# Patient Record
Sex: Female | Born: 1935 | Race: White | Hispanic: No | Marital: Married | Smoking: Never smoker
Health system: Southern US, Community
[De-identification: ages and names within clinical notes are randomized; demographics above are authoritative.]

## PROBLEM LIST (undated history)

## (undated) DIAGNOSIS — C50919 Malignant neoplasm of unspecified site of unspecified female breast: Secondary | ICD-10-CM

## (undated) DIAGNOSIS — F0390 Unspecified dementia without behavioral disturbance: Secondary | ICD-10-CM

## (undated) HISTORY — PX: MASTECTOMY: SHX3

## (undated) HISTORY — PX: BACK SURGERY: SHX140

---

## 1997-11-22 ENCOUNTER — Ambulatory Visit (HOSPITAL_COMMUNITY): Admission: RE | Admit: 1997-11-22 | Discharge: 1997-11-22 | Payer: Self-pay | Admitting: Internal Medicine

## 1998-03-11 ENCOUNTER — Ambulatory Visit (HOSPITAL_COMMUNITY): Admission: RE | Admit: 1998-03-11 | Discharge: 1998-03-11 | Payer: Self-pay | Admitting: Obstetrics & Gynecology

## 1998-12-05 ENCOUNTER — Ambulatory Visit (HOSPITAL_COMMUNITY): Admission: RE | Admit: 1998-12-05 | Discharge: 1998-12-05 | Payer: Self-pay | Admitting: Internal Medicine

## 1998-12-13 ENCOUNTER — Ambulatory Visit (HOSPITAL_COMMUNITY): Admission: RE | Admit: 1998-12-13 | Discharge: 1998-12-13 | Payer: Self-pay

## 1999-03-10 ENCOUNTER — Ambulatory Visit (HOSPITAL_COMMUNITY): Admission: RE | Admit: 1999-03-10 | Discharge: 1999-03-10 | Payer: Self-pay | Admitting: *Deleted

## 1999-12-21 ENCOUNTER — Ambulatory Visit (HOSPITAL_COMMUNITY): Admission: RE | Admit: 1999-12-21 | Discharge: 1999-12-21 | Payer: Self-pay | Admitting: Internal Medicine

## 2000-03-08 ENCOUNTER — Ambulatory Visit (HOSPITAL_COMMUNITY): Admission: RE | Admit: 2000-03-08 | Discharge: 2000-03-08 | Payer: Self-pay | Admitting: Obstetrics & Gynecology

## 2000-12-25 ENCOUNTER — Encounter: Payer: Self-pay | Admitting: Internal Medicine

## 2000-12-25 ENCOUNTER — Ambulatory Visit (HOSPITAL_COMMUNITY): Admission: RE | Admit: 2000-12-25 | Discharge: 2000-12-25 | Payer: Self-pay | Admitting: Internal Medicine

## 2001-01-01 ENCOUNTER — Encounter: Admission: RE | Admit: 2001-01-01 | Discharge: 2001-01-01 | Payer: Self-pay | Admitting: Internal Medicine

## 2001-01-01 ENCOUNTER — Encounter: Payer: Self-pay | Admitting: Internal Medicine

## 2001-11-21 ENCOUNTER — Ambulatory Visit (HOSPITAL_COMMUNITY): Admission: RE | Admit: 2001-11-21 | Discharge: 2001-11-21 | Payer: Self-pay | Admitting: Obstetrics & Gynecology

## 2002-01-02 ENCOUNTER — Encounter: Admission: RE | Admit: 2002-01-02 | Discharge: 2002-01-02 | Payer: Self-pay | Admitting: Oncology

## 2002-01-02 ENCOUNTER — Encounter: Payer: Self-pay | Admitting: Oncology

## 2004-04-12 ENCOUNTER — Ambulatory Visit: Payer: Self-pay | Admitting: Oncology

## 2004-05-04 ENCOUNTER — Ambulatory Visit: Payer: Self-pay | Admitting: Oncology

## 2004-05-10 ENCOUNTER — Ambulatory Visit: Payer: Self-pay | Admitting: Internal Medicine

## 2004-10-12 ENCOUNTER — Ambulatory Visit: Payer: Self-pay | Admitting: Oncology

## 2004-11-02 ENCOUNTER — Ambulatory Visit: Payer: Self-pay | Admitting: Oncology

## 2005-01-16 ENCOUNTER — Ambulatory Visit: Payer: Self-pay | Admitting: Oncology

## 2005-02-02 ENCOUNTER — Ambulatory Visit: Payer: Self-pay | Admitting: Oncology

## 2005-04-12 ENCOUNTER — Ambulatory Visit: Payer: Self-pay | Admitting: Oncology

## 2005-05-04 ENCOUNTER — Ambulatory Visit: Payer: Self-pay | Admitting: Oncology

## 2005-08-02 ENCOUNTER — Ambulatory Visit: Payer: Self-pay | Admitting: Gastroenterology

## 2005-08-10 ENCOUNTER — Ambulatory Visit: Payer: Self-pay | Admitting: Oncology

## 2005-09-02 ENCOUNTER — Ambulatory Visit: Payer: Self-pay | Admitting: Oncology

## 2005-10-25 ENCOUNTER — Ambulatory Visit: Payer: Self-pay | Admitting: Oncology

## 2006-02-06 ENCOUNTER — Ambulatory Visit: Payer: Self-pay | Admitting: Oncology

## 2006-03-04 ENCOUNTER — Ambulatory Visit: Payer: Self-pay | Admitting: Oncology

## 2006-06-06 ENCOUNTER — Ambulatory Visit: Payer: Self-pay | Admitting: Oncology

## 2006-07-05 ENCOUNTER — Ambulatory Visit: Payer: Self-pay | Admitting: Oncology

## 2006-11-27 ENCOUNTER — Ambulatory Visit: Payer: Self-pay | Admitting: Oncology

## 2006-12-04 ENCOUNTER — Ambulatory Visit: Payer: Self-pay | Admitting: Oncology

## 2007-01-03 ENCOUNTER — Ambulatory Visit: Payer: Self-pay | Admitting: Oncology

## 2007-01-16 ENCOUNTER — Ambulatory Visit: Payer: Self-pay | Admitting: Oncology

## 2007-02-03 ENCOUNTER — Ambulatory Visit: Payer: Self-pay | Admitting: Oncology

## 2007-07-17 ENCOUNTER — Ambulatory Visit: Payer: Self-pay | Admitting: Oncology

## 2007-08-03 ENCOUNTER — Ambulatory Visit: Payer: Self-pay | Admitting: Oncology

## 2007-09-11 ENCOUNTER — Ambulatory Visit: Payer: Self-pay | Admitting: Unknown Physician Specialty

## 2007-11-28 ENCOUNTER — Ambulatory Visit: Payer: Self-pay | Admitting: Oncology

## 2008-01-14 ENCOUNTER — Ambulatory Visit: Payer: Self-pay | Admitting: Oncology

## 2008-02-03 ENCOUNTER — Ambulatory Visit: Payer: Self-pay | Admitting: Oncology

## 2008-07-05 ENCOUNTER — Ambulatory Visit: Payer: Self-pay | Admitting: Oncology

## 2008-07-14 ENCOUNTER — Ambulatory Visit: Payer: Self-pay | Admitting: Oncology

## 2008-08-02 ENCOUNTER — Ambulatory Visit: Payer: Self-pay | Admitting: Oncology

## 2008-11-27 ENCOUNTER — Emergency Department: Payer: Self-pay | Admitting: Internal Medicine

## 2008-12-09 ENCOUNTER — Ambulatory Visit: Payer: Self-pay | Admitting: Internal Medicine

## 2009-01-02 ENCOUNTER — Ambulatory Visit: Payer: Self-pay | Admitting: Oncology

## 2009-01-12 ENCOUNTER — Ambulatory Visit: Payer: Self-pay | Admitting: Oncology

## 2009-02-02 ENCOUNTER — Ambulatory Visit: Payer: Self-pay | Admitting: Oncology

## 2009-02-26 ENCOUNTER — Emergency Department: Payer: Self-pay | Admitting: Emergency Medicine

## 2009-07-05 ENCOUNTER — Ambulatory Visit: Payer: Self-pay | Admitting: Oncology

## 2009-07-13 ENCOUNTER — Ambulatory Visit: Payer: Self-pay | Admitting: Oncology

## 2009-08-02 ENCOUNTER — Ambulatory Visit: Payer: Self-pay | Admitting: Oncology

## 2009-09-24 ENCOUNTER — Inpatient Hospital Stay: Payer: Self-pay | Admitting: Internal Medicine

## 2009-12-13 ENCOUNTER — Ambulatory Visit: Payer: Self-pay | Admitting: Oncology

## 2010-01-02 ENCOUNTER — Ambulatory Visit: Payer: Self-pay | Admitting: Oncology

## 2010-01-11 ENCOUNTER — Ambulatory Visit: Payer: Self-pay | Admitting: Oncology

## 2010-01-30 ENCOUNTER — Ambulatory Visit: Payer: Self-pay | Admitting: Internal Medicine

## 2010-02-02 ENCOUNTER — Ambulatory Visit: Payer: Self-pay | Admitting: Oncology

## 2010-02-03 ENCOUNTER — Ambulatory Visit: Payer: Self-pay | Admitting: Otolaryngology

## 2010-02-09 ENCOUNTER — Ambulatory Visit: Payer: Self-pay | Admitting: Otolaryngology

## 2010-02-10 ENCOUNTER — Ambulatory Visit: Payer: Self-pay | Admitting: Oncology

## 2010-02-11 LAB — PATHOLOGY REPORT

## 2010-07-31 ENCOUNTER — Ambulatory Visit: Payer: Self-pay | Admitting: Oncology

## 2010-08-01 LAB — CANCER ANTIGEN 27.29: CA 27.29: 8.9 U/mL (ref 0.0–38.6)

## 2010-08-03 ENCOUNTER — Ambulatory Visit: Payer: Self-pay | Admitting: Oncology

## 2010-09-03 ENCOUNTER — Ambulatory Visit: Payer: Self-pay | Admitting: Oncology

## 2010-12-18 ENCOUNTER — Ambulatory Visit: Payer: Self-pay | Admitting: Oncology

## 2011-02-08 ENCOUNTER — Ambulatory Visit: Payer: Self-pay | Admitting: Oncology

## 2011-02-09 LAB — CANCER ANTIGEN 27.29: CA 27.29: 10.2 U/mL (ref 0.0–38.6)

## 2011-03-05 ENCOUNTER — Ambulatory Visit: Payer: Self-pay | Admitting: Oncology

## 2011-08-15 ENCOUNTER — Ambulatory Visit: Payer: Self-pay | Admitting: Oncology

## 2011-08-15 LAB — CBC CANCER CENTER
Basophil #: 0 x10 3/mm (ref 0.0–0.1)
Basophil %: 0.4 %
Eosinophil #: 0.1 x10 3/mm (ref 0.0–0.7)
HGB: 12.7 g/dL (ref 12.0–16.0)
Lymphocyte #: 1.6 x10 3/mm (ref 1.0–3.6)
MCH: 31.1 pg (ref 26.0–34.0)
MCHC: 33.5 g/dL (ref 32.0–36.0)
MCV: 93 fL (ref 80–100)
Monocyte #: 0.7 x10 3/mm (ref 0.0–0.7)
Neutrophil #: 4.6 x10 3/mm (ref 1.4–6.5)
Neutrophil %: 65.7 %
RBC: 4.08 10*6/uL (ref 3.80–5.20)

## 2011-08-15 LAB — COMPREHENSIVE METABOLIC PANEL
Albumin: 3.6 g/dL (ref 3.4–5.0)
Alkaline Phosphatase: 58 U/L (ref 50–136)
Anion Gap: 5 — ABNORMAL LOW (ref 7–16)
Calcium, Total: 8.9 mg/dL (ref 8.5–10.1)
EGFR (Non-African Amer.): >60
Glucose: 102 mg/dL — ABNORMAL HIGH (ref 65–99)
Potassium: 4.2 mmol/L (ref 3.5–5.1)
SGOT(AST): 31 U/L (ref 15–37)
SGPT (ALT): 37 U/L
Total Protein: 7.2 g/dL (ref 6.4–8.2)

## 2011-09-03 ENCOUNTER — Ambulatory Visit: Payer: Self-pay | Admitting: Oncology

## 2012-01-28 ENCOUNTER — Ambulatory Visit: Payer: Self-pay | Admitting: Oncology

## 2012-02-11 ENCOUNTER — Ambulatory Visit: Payer: Self-pay | Admitting: Oncology

## 2012-02-11 LAB — CBC CANCER CENTER
Basophil #: 0 x10 3/mm (ref 0.0–0.1)
Eosinophil #: 0.1 x10 3/mm (ref 0.0–0.7)
Eosinophil %: 1 %
HGB: 12.5 g/dL (ref 12.0–16.0)
Lymphocyte #: 1.6 x10 3/mm (ref 1.0–3.6)
Lymphocyte %: 20.8 %
MCV: 90 fL (ref 80–100)
Monocyte %: 10.2 %
Neutrophil #: 5.1 x10 3/mm (ref 1.4–6.5)
Neutrophil %: 67.4 %
Platelet: 230 x10 3/mm (ref 150–440)
RBC: 4.34 10*6/uL (ref 3.80–5.20)
WBC: 7.6 x10 3/mm (ref 3.6–11.0)

## 2012-02-11 LAB — COMPREHENSIVE METABOLIC PANEL
Albumin: 3.5 g/dL (ref 3.4–5.0)
Anion Gap: 8 (ref 7–16)
BUN: 19 mg/dL — ABNORMAL HIGH (ref 7–18)
Bilirubin,Total: 0.5 mg/dL (ref 0.2–1.0)
Calcium, Total: 9.7 mg/dL (ref 8.5–10.1)
Creatinine: 1.06 mg/dL (ref 0.60–1.30)
EGFR (Non-African Amer.): 48 — ABNORMAL LOW
Glucose: 101 mg/dL — ABNORMAL HIGH (ref 65–99)
Osmolality: 284 (ref 275–301)
Potassium: 4.1 mmol/L (ref 3.5–5.1)
SGOT(AST): 24 U/L (ref 15–37)
Sodium: 141 mmol/L (ref 136–145)
Total Protein: 7.5 g/dL (ref 6.4–8.2)

## 2012-03-04 ENCOUNTER — Ambulatory Visit: Payer: Self-pay | Admitting: Oncology

## 2012-07-17 ENCOUNTER — Inpatient Hospital Stay: Payer: Self-pay

## 2012-07-17 LAB — CBC
HGB: 11.1 g/dL — ABNORMAL LOW (ref 12.0–16.0)
MCH: 30.2 pg (ref 26.0–34.0)
MCHC: 33 g/dL (ref 32.0–36.0)
MCV: 92 fL (ref 80–100)
RDW: 13.8 % (ref 11.5–14.5)
WBC: 6.4 10*3/uL (ref 3.6–11.0)

## 2012-07-17 LAB — COMPREHENSIVE METABOLIC PANEL
Albumin: 2.9 g/dL — ABNORMAL LOW (ref 3.4–5.0)
Alkaline Phosphatase: 47 U/L — ABNORMAL LOW (ref 50–136)
Anion Gap: 7 (ref 7–16)
BUN: 20 mg/dL — ABNORMAL HIGH (ref 7–18)
Bilirubin,Total: 0.4 mg/dL (ref 0.2–1.0)
Calcium, Total: 8 mg/dL — ABNORMAL LOW (ref 8.5–10.1)
Chloride: 110 mmol/L — ABNORMAL HIGH (ref 98–107)
Co2: 25 mmol/L (ref 21–32)
Creatinine: 1.13 mg/dL (ref 0.60–1.30)
EGFR (African American): 51 — ABNORMAL LOW
EGFR (Non-African Amer.): 44 — ABNORMAL LOW
Glucose: 147 mg/dL — ABNORMAL HIGH (ref 65–99)
Osmolality: 288 (ref 275–301)
Potassium: 4 mmol/L (ref 3.5–5.1)
SGOT(AST): 26 U/L (ref 15–37)
SGPT (ALT): 24 U/L (ref 12–78)
Sodium: 142 mmol/L (ref 136–145)
Total Protein: 5.9 g/dL — ABNORMAL LOW (ref 6.4–8.2)

## 2012-07-17 LAB — CK TOTAL AND CKMB (NOT AT ARMC)
CK, Total: 90 U/L (ref 21–215)
CK-MB: 1.2 ng/mL (ref 0.5–3.6)

## 2012-07-17 LAB — URINALYSIS, COMPLETE
Leukocyte Esterase: NEGATIVE
Ph: 7 (ref 4.5–8.0)
RBC,UR: <1 /HPF (ref 0–5)
Specific Gravity: 1.01 (ref 1.003–1.030)
Squamous Epithelial: <1

## 2012-07-17 LAB — TROPONIN I: Troponin-I: <0.02 ng/mL

## 2012-07-18 LAB — CBC WITH DIFFERENTIAL/PLATELET
Basophil #: 0 10*3/uL (ref 0.0–0.1)
Basophil %: 0.4 %
Eosinophil #: 0.2 10*3/uL (ref 0.0–0.7)
Eosinophil %: 2.4 %
HGB: 10.8 g/dL — ABNORMAL LOW (ref 12.0–16.0)
Lymphocyte %: 17.8 %
MCH: 30.4 pg (ref 26.0–34.0)
MCHC: 33.7 g/dL (ref 32.0–36.0)
MCV: 90 fL (ref 80–100)
Monocyte %: 11.2 %
Platelet: 164 10*3/uL (ref 150–440)
RDW: 14 % (ref 11.5–14.5)
WBC: 6.8 10*3/uL (ref 3.6–11.0)

## 2012-07-18 LAB — BASIC METABOLIC PANEL
Anion Gap: 6 — ABNORMAL LOW (ref 7–16)
Calcium, Total: 8 mg/dL — ABNORMAL LOW (ref 8.5–10.1)
Chloride: 112 mmol/L — ABNORMAL HIGH (ref 98–107)
Co2: 27 mmol/L (ref 21–32)
Creatinine: 0.82 mg/dL (ref 0.60–1.30)
EGFR (African American): >60
EGFR (Non-African Amer.): >60
Potassium: 3.6 mmol/L (ref 3.5–5.1)
Sodium: 145 mmol/L (ref 136–145)

## 2012-07-18 LAB — LIPID PANEL
Cholesterol: 127 mg/dL (ref 0–200)
HDL Cholesterol: 51 mg/dL (ref 40–60)

## 2012-07-20 LAB — BASIC METABOLIC PANEL
Anion Gap: 8 (ref 7–16)
BUN: 14 mg/dL (ref 7–18)
Calcium, Total: 8.3 mg/dL — ABNORMAL LOW (ref 8.5–10.1)
Co2: 26 mmol/L (ref 21–32)
Creatinine: 0.68 mg/dL (ref 0.60–1.30)
EGFR (African American): >60
EGFR (Non-African Amer.): >60
Glucose: 92 mg/dL (ref 65–99)
Potassium: 3.6 mmol/L (ref 3.5–5.1)

## 2012-07-20 LAB — MAGNESIUM: Magnesium: 1.7 mg/dL — ABNORMAL LOW

## 2012-07-21 LAB — BASIC METABOLIC PANEL
BUN: 10 mg/dL (ref 7–18)
Chloride: 109 mmol/L — ABNORMAL HIGH (ref 98–107)
Co2: 28 mmol/L (ref 21–32)
Creatinine: 0.61 mg/dL (ref 0.60–1.30)
EGFR (African American): >60
Potassium: 3.5 mmol/L (ref 3.5–5.1)

## 2012-07-21 LAB — MAGNESIUM: Magnesium: 1.6 mg/dL — ABNORMAL LOW

## 2012-07-24 ENCOUNTER — Encounter: Payer: Self-pay | Admitting: Internal Medicine

## 2012-08-02 ENCOUNTER — Encounter: Payer: Self-pay | Admitting: Internal Medicine

## 2012-08-02 ENCOUNTER — Ambulatory Visit: Payer: Self-pay | Admitting: Oncology

## 2012-09-16 ENCOUNTER — Ambulatory Visit: Payer: Self-pay | Admitting: Oncology

## 2012-09-30 LAB — CBC CANCER CENTER
Basophil %: 0.5 %
Eosinophil #: 0.1 x10 3/mm (ref 0.0–0.7)
Eosinophil %: 1.1 %
HCT: 36.9 % (ref 35.0–47.0)
Lymphocyte #: 1.3 x10 3/mm (ref 1.0–3.6)
Lymphocyte %: 14.1 %
Monocyte %: 7.9 %
Neutrophil #: 7.3 x10 3/mm — ABNORMAL HIGH (ref 1.4–6.5)
Platelet: 249 x10 3/mm (ref 150–440)
RBC: 4.11 10*6/uL (ref 3.80–5.20)
RDW: 14.5 % (ref 11.5–14.5)

## 2012-09-30 LAB — COMPREHENSIVE METABOLIC PANEL
Albumin: 3.4 g/dL (ref 3.4–5.0)
Alkaline Phosphatase: 56 U/L (ref 50–136)
Anion Gap: 3 — ABNORMAL LOW (ref 7–16)
BUN: 17 mg/dL (ref 7–18)
Calcium, Total: 8.8 mg/dL (ref 8.5–10.1)
Chloride: 106 mmol/L (ref 98–107)
Creatinine: 0.78 mg/dL (ref 0.60–1.30)
EGFR (Non-African Amer.): >60
Potassium: 3.7 mmol/L (ref 3.5–5.1)
SGOT(AST): 25 U/L (ref 15–37)
Sodium: 140 mmol/L (ref 136–145)

## 2012-10-02 ENCOUNTER — Ambulatory Visit: Payer: Self-pay | Admitting: Oncology

## 2013-06-10 DIAGNOSIS — F02818 Dementia in other diseases classified elsewhere, unspecified severity, with other behavioral disturbance: Secondary | ICD-10-CM | POA: Insufficient documentation

## 2013-06-10 DIAGNOSIS — G301 Alzheimer's disease with late onset: Secondary | ICD-10-CM

## 2013-06-10 DIAGNOSIS — F0281 Dementia in other diseases classified elsewhere with behavioral disturbance: Secondary | ICD-10-CM | POA: Insufficient documentation

## 2013-06-10 DIAGNOSIS — G4700 Insomnia, unspecified: Secondary | ICD-10-CM | POA: Insufficient documentation

## 2013-10-09 ENCOUNTER — Ambulatory Visit: Payer: Self-pay | Admitting: Oncology

## 2013-10-13 LAB — CBC CANCER CENTER
Basophil #: 0.1 x10 3/mm (ref 0.0–0.1)
Basophil %: 0.7 %
EOS ABS: 0.3 x10 3/mm (ref 0.0–0.7)
EOS PCT: 3.6 %
HCT: 41.6 % (ref 35.0–47.0)
HGB: 13.7 g/dL (ref 12.0–16.0)
Lymphocyte #: 1.3 x10 3/mm (ref 1.0–3.6)
Lymphocyte %: 17.4 %
MCH: 30.1 pg (ref 26.0–34.0)
MCHC: 33 g/dL (ref 32.0–36.0)
MCV: 91 fL (ref 80–100)
MONOS PCT: 9 %
Monocyte #: 0.7 x10 3/mm (ref 0.2–0.9)
Neutrophil #: 5.1 x10 3/mm (ref 1.4–6.5)
Neutrophil %: 69.3 %
Platelet: 218 x10 3/mm (ref 150–440)
RBC: 4.56 10*6/uL (ref 3.80–5.20)
RDW: 13.5 % (ref 11.5–14.5)
WBC: 7.4 x10 3/mm (ref 3.6–11.0)

## 2013-10-13 LAB — COMPREHENSIVE METABOLIC PANEL
ALBUMIN: 3.6 g/dL (ref 3.4–5.0)
ANION GAP: 8 (ref 7–16)
Alkaline Phosphatase: 48 U/L
BUN: 17 mg/dL (ref 7–18)
Bilirubin,Total: 0.5 mg/dL (ref 0.2–1.0)
CALCIUM: 9.2 mg/dL (ref 8.5–10.1)
Chloride: 105 mmol/L (ref 98–107)
Co2: 30 mmol/L (ref 21–32)
Creatinine: 1.02 mg/dL (ref 0.60–1.30)
EGFR (African American): 57 — ABNORMAL LOW
EGFR (Non-African Amer.): 49 — ABNORMAL LOW
Glucose: 104 mg/dL — ABNORMAL HIGH (ref 65–99)
Osmolality: 287 (ref 275–301)
POTASSIUM: 3.8 mmol/L (ref 3.5–5.1)
SGOT(AST): 16 U/L (ref 15–37)
SGPT (ALT): 15 U/L (ref 12–78)
Sodium: 143 mmol/L (ref 136–145)
TOTAL PROTEIN: 7 g/dL (ref 6.4–8.2)

## 2013-11-02 ENCOUNTER — Ambulatory Visit: Payer: Self-pay | Admitting: Oncology

## 2014-03-19 DIAGNOSIS — I495 Sick sinus syndrome: Secondary | ICD-10-CM | POA: Insufficient documentation

## 2014-09-16 ENCOUNTER — Emergency Department
Admit: 2014-09-16 | Disposition: A | Discharge status: Home / Self Care | Payer: Self-pay | Admitting: Emergency Medicine

## 2014-09-16 LAB — CBC
HCT: 42.2 %
HGB: 13.5 g/dL
MCH: 29.8 pg
MCHC: 32.1 g/dL
MCV: 93 fL
Platelet: 229 x10 3/mm 3
RBC: 4.55 X10 6/mm 3
RDW: 13.3 %
WBC: 6.9 x10 3/mm 3

## 2014-09-16 LAB — URINALYSIS, COMPLETE
Bacteria: NONE SEEN
Bilirubin,UR: NEGATIVE
Blood: NEGATIVE
Glucose,UR: NEGATIVE mg/dL
Ketone: NEGATIVE
Leukocyte Esterase: NEGATIVE
Nitrite: NEGATIVE
Ph: 7
Protein: NEGATIVE
Specific Gravity: 1.012

## 2014-09-16 LAB — COMPREHENSIVE METABOLIC PANEL
ANION GAP: 5 — AB (ref 7–16)
Albumin: 3.9 g/dL
Alkaline Phosphatase: 53 U/L
BUN: 15 mg/dL
Bilirubin,Total: 0.3 mg/dL
CALCIUM: 9 mg/dL
CHLORIDE: 107 mmol/L
Co2: 29 mmol/L
Creatinine: 0.77 mg/dL
EGFR (African American): >60
EGFR (Non-African Amer.): >60
GLUCOSE: 97 mg/dL
Potassium: 4 mmol/L
SGOT(AST): 21 U/L
SGPT (ALT): 15 U/L
Sodium: 141 mmol/L
Total Protein: 7.2 g/dL

## 2014-09-16 LAB — TROPONIN I: Troponin-I: <0.03 ng/mL

## 2014-09-16 LAB — DRUG SCREEN, URINE
AMPHETAMINES, UR SCREEN: NEGATIVE
BARBITURATES, UR SCREEN: NEGATIVE
BENZODIAZEPINE, UR SCRN: NEGATIVE
Cannabinoid 50 Ng, Ur ~~LOC~~: NEGATIVE
Cocaine Metabolite,Ur ~~LOC~~: NEGATIVE
MDMA (Ecstasy)Ur Screen: NEGATIVE
METHADONE, UR SCREEN: NEGATIVE
Opiate, Ur Screen: NEGATIVE
PHENCYCLIDINE (PCP) UR S: NEGATIVE
Tricyclic, Ur Screen: NEGATIVE

## 2014-09-24 NOTE — Discharge Summary (Signed)
PATIENT NAME:  JIMMYE, WISNIESKI MR#:  [id number] DATE OF BIRTH:  April 21, 1924  DATE OF ADMISSION:  07/17/2012 DATE OF DISCHARGE:  07/23/2012   ADDENDUM  Yesterday afternoon the patient had a period of unsteadiness and near fall when using the toilet. PT reassessed her and recommended skilled nursing facility. She remained stable overnight. She will be discharged on the same medications and same followup plan to nursing facility.    ____________________________ Cheral Marker. Ola Spurr, MD dpf:jm D: 07/23/2012 13:42:00 ET T: 07/23/2012 13:51:57 ET JOB#: [id number]  cc: Cheral Marker. Ola Spurr, MD, <Dictator> Bernarr Longsworth Ola Spurr MD ELECTRONICALLY SIGNED 08/06/2012 19:24

## 2014-09-24 NOTE — H&P (Signed)
PATIENT NAME:  Beth Randall, Beth Randall MR#:  [id number] DATE OF BIRTH:  [birth year]  DATE OF ADMISSION:  07/17/2012  PRIMARY CARE PHYSICIAN: Dr. Andrey Farmer, now Dr. Ola Spurr   CHIEF COMPLAINT: Syncopal episode and fall with collapse.   REFERRING PHYSICIAN: Dr. Leonides Sake  HISTORY OF PRESENT ILLNESS: [name] is a very nice 81 year old female, who has a history of breast cancer status post right mastectomy. Overall, she is very healthy and very active. She actually plays tennis very often and walks a lot.   The patient comes today with a history of a syncopal episode that happened 2 nights ago while having dinner. The patient had no symptoms prior to the episode. She did not feel dizzy or lightheaded. She was not having any chest pain, shortness of breath or anything else that she can mention. She just collapsed. There was not a clear history of orthostatic symptoms, but the patient was able to go home and did not really care much about it up until today when she was home with her son, she had another syncopal episode. When she collapsed again, she was out for a couple minutes and then came back, and she was asymptomatic again. There was no clear history of orthostatic hypotension at that moment either. There was not any other symptomatology.   The patient was brought here to the ER. When she was observed, she was put on the monitor, and her heart rate has been going up and down. The lowest it has been is 28. The highest it has been is 124.   At this moment, her heart rate is around 70 but is coming down to the mid 40s while I am seeing the patient.   The patient is doing okay right now, and she is asymptomatic.   REVIEW OF SYSTEMS:  CONSTITUTIONAL: Denies any fever, fatigue, weight loss, weight gain.  EYES: Denies any blurry vision, glaucoma. She did have cataract surgery.  ENT: No tinnitus. No ear pain. The patient states that she has a cold going on where she has nasal congestion and  occasional cough, but no shortness of breath or pulmonary congestion.  RESPIRATORY: Positive mild cough due to postnasal drip. Negative wheezing, hemoptysis or dyspnea.  CARDIOVASCULAR: No chest pain. No orthopnea. No edema. No arrhythmias. No palpitations. Positive syncope x2. No varicose veins.  GASTROINTESTINAL: No nausea, vomiting, abdominal pain, rectal bleeding or hemorrhoids.  GENITOURINARY: No dysuria, hematuria. No changes in frequency.  GYNECOLOGIC: Positive breast cancer status post right mastectomy. No new masses or other breast problems.  ENDOCRINE: No polyuria, polydipsia or polyphagia. No cold or heat intolerance. No thyroid problems.  HEMATOLOGIC/LYMPHATIC: No anemia, easy bruising or bleeding or swollen glands.  SKIN: Without any rashes or petechiae.  MUSCULOSKELETAL: No significant back pain, neck pain, shoulder pain or gout.  NEUROLOGIC: No numbness, weakness. No CVAs. No TIAs. No ataxia.  PSYCHIATRIC: No insomnia, depression or other mood problems.   PAST MEDICAL HISTORY: 1.  Breast cancer.  2.  Osteoarthritis.  3.  Cataracts.  3.  Colonic polyps.   PAST SURGICAL HISTORY: 1.  Status post colon polyp removal.  2.  Bilateral cataract surgery.  3.  Right mastectomy.  4.  Cholecystectomy.   SOCIAL HISTORY: The patient lives with her husband and son. She plays tennis very often, once or twice a week. She has never smoked, has never drank. Overall, she has been healthy.   FAMILY HISTORY: She denies any hypertension, MIs in her family. Her mother and father  died from natural deaths.   PHYSICAL EXAMINATION: VITAL SIGNS: Heart rate has been going up in between 124 the highest, 28 the lowest; seen on the monitor right now in between 40 and 70. Blood pressure was as low as 77/39, now is in the mid 90s/70s, which apparently is her normal blood pressure. Her respiratory rate is 20. Temperature is afebrile.  GENERAL: The patient is alert, oriented x3, no acute distress, no  respiratory distress, hemodynamically stable.  HEENT: Pupils are equal and reactive. Extraocular movements are intact. Mucosa is moist. Anicteric sclerae. Pink conjunctivae. No oral lesions. No oropharyngeal exudates.  NECK: Supple. No JVD. No thyromegaly. No adenopathy. No carotid bruits. The patient is thin. No rigidity of the neck.  CARDIOVASCULAR: Regular rate and rhythm. No murmurs, rubs or gallops are appreciated. At this moment, heart rate is in the mid 70s but comes down to the 50s and 40s. No displacement of PMI. No tenderness to palpation of anterior chest wall.  LUNGS: Clear without any wheezing or crepitus. No use of accessory muscles. No dullness to percussion.  ABDOMEN: Soft, nontender, nondistended. No hepatosplenomegaly. No masses. Bowel sounds are positive.  EXTREMITIES: No edema. No cyanosis. No clubbing.  NEUROLOGIC: Cranial nerves II-XII intact. No focal findings. Strength is 5/5 in 4 extremities.  SKIN: Without any rashes or petechiae, decreased turgor.  PSYCHIATRIC: Mood is normal without any signs of depression or anxiety.  MUSCULOSKELETAL: No significant joint deformities or effusions.  LYMPHATIC: Negative for any lymphadenopathy in the neck or supraclavicular areas.   EKG: The patient has normal sinus rhythm with occasional PVCs, actually only 1 PVC, may have some junctional rhythm, and apparently has a borderline first-degree AV block as well.   LABS: Glucose 147, BUN is 20, creatinine 113, chloride is 110.   Calcium 8, total protein 5.9, albumin 2.9, alkaline phosphatase less than 47. White blood count is 6.4, hemoglobin 11.1, platelets 164. UA is negative for signs of infection.   ASSESSMENT AND PLAN: An 81 year old female with history of breast cancer in the past status post mastectomy, healthy otherwise, who comes with a history of a syncopal episode 2 nights ago and then today, low blood pressure and a heart rate that ranges in between 28 and 124.  1.  Syncope,  likely due to significant bradycardia. The patient did not show any orthostatic symptoms, although her BUN is slightly elevated and she looks a little bit dehydrated, and her blood pressure was low, for which fluids were given. I do not think there is any need for any further testing, like an ultrasound of the carotids or MRIs, but we are going to go ahead and get an echo. Cardiology consult done due to her tachybrady syndrome.  2.  Tachybrady syndrome. The patient has heart rate going between 28 to 124. At this moment, her heart rate is staying around 70s, dropping down to the 40s. Due to the severity of her bradycardia and symptomatology not associated with changes on position, I am going to send the patient to the Critical Care Unit, put pads on her chest and make sure she does not have any significant events. Atropine is going to be given in case of symptomatic bradycardia. The patient's low blood pressure has improved after 2 L of IV fluids, and that was likely due to the patient being slightly dehydrated and also bradycardic. The patient's normal blood pressure is usually in between 90 and 100/70s. At this moment, that is why the blood pressure has been  going on, but the lowest that she was was 70s/40s here. Cardiology has been consulted. The patient is very likely to need a pacemaker.  3.  History of breast cancer, has resolved, status post right mastectomy.  4.  Elevated blood sugar. This is likely due to stress. The patient does not have a history of diabetes. She is very thin, so I am going to repeat the blood sugar in the morning and if there is any elevation consider checking hemoglobin A1c. I am not going to order that for now.  5.  Mild dehydration status post IV fluids.  6.  Hypotension, has resolved, and it was likely due to dehydration.  7.  Slight anemia. The patient has a history of chronic anemia, and at this moment, her hemoglobin is stable at 11.1.  8.  The patient is a FULL CODE at this  moment. She is going to discuss this with her family, and still, there is a question if she would like to have a pacemaker or not.  9.  The patient is going to be admitted to the Critical Care Unit for potential life-threatening arrhythmias like severe bradycardia, and she is going to have external pacemaker applied to her in case her heart rate comes down again and she is symptomatic.   CRITICAL CARE TIME: About 50 minutes.    ____________________________ Kosse Sink, MD rsg:lg D: 07/17/2012 15:28:57 ET T: 07/17/2012 16:14:22 ET JOB#: [id number]  cc: Eagle Sink, MD, <Dictator> Eshaal Duby America Brown MD ELECTRONICALLY SIGNED 07/23/2012 14:31

## 2014-09-24 NOTE — Discharge Summary (Signed)
PATIENT NAME:  Beth Randall, Beth Randall MR#:  [id number] DATE OF BIRTH:  [birth year]  DATE OF ADMISSION:  07/17/2012 DATE OF DISCHARGE:  07/22/2012  PRIMARY CARE PHYSICIAN: Adrian Prows, MD  CONSULTING CARDIOLOGIST: Bartholome Bill, MD   DISCHARGE DIAGNOSES: 1. Syncope, likely due to sick sinus syndrome.  2. Bradycardia, possible evidence of sick sinus syndrome.  3. Dementia, moderate.  4. Mild malnutrition   HISTORY OF PRESENT ILLNESS: Please see admission history and physical dated 07/17/2012. Briefly, this is an 81 year old woman, who lives with her husband, who suffered recent falling. She also lives with her son. She had been pretty active except for her dementia. She came in with a syncopal episode 2 days prior when she had injured her elbow at the time. When she was brought to the Emergency Room, she was noted to have bradycardia on the monitor with heart rate down to the 30s. She was admitted for further evaluation and consideration of a permanent pacemaker.   HOSPITAL COURSE BY ISSUE:  1. Syncope: Evaluation was performed, including echocardiogram and telemetry. Her echo showed normal EF of 60% with mild-to-moderate MR and TR. Telemetry remained normal without any significant brady- or tachyarrhythmias. No further syncopal episodes occurred. She was seen by Cardiology initially and spent a day or two in the ICU.  Decision was made since she did not meet criteria for permanent pacemaker that she would not need a pacemaker at this time. Plan will be to discharge her on a 48-hour Holter monitor to see how her heart rate does when she is in her usual environment with her usual level of activity.  2. Dementia: This is moderate. She is not on treatment for it. She required some dosing of Haldol for agitation and also required a sitter. She will be discharged on Haldol.  3. Mild malnutrition: Albumin was 2.9 and BMI is 16. She is on a multivitamin and was started on [employer]  supplements.  4. Immobility and falls: The patient was seen by PT who recommended home with Home Health. We will arrange this for physical therapy and nursing.   DISCHARGE FOLLOWUP: The patient will follow up with Dr. Ola Spurr in 1 to 2 weeks and with Dr. Ubaldo Glassing in 1 to 2 weeks.   DISCHARGE MEDICATIONS: 1. Multivitamin once a day.  2. Haldol 0.5 mg, 1 tablet 3 times a day as needed for agitation.   REFERRAL:  [facility]charge Home [facility] for physical therapy and nursing.   DISCHARGE DIET: Regular with a regular consistency.   DISCHARGE ACTIVITY: As tolerated.   TIME SPENT: This discharge took 35 minutes.   ____________________________ Cheral Marker. Ola Spurr, MD dpf:cb D: 07/22/2012 13:10:00 ET T: 07/22/2012 13:29:57 ET JOB#: [id number]  cc: Cheral Marker. Ola Spurr, MD, <Dictator> Pakou Rainbow Ola Spurr MD ELECTRONICALLY SIGNED 08/06/2012 19:24

## 2014-09-24 NOTE — Consult Note (Signed)
   General Aspect 81 yo female with no history of heart disease who has been fairly active despite her advanced age playing tennis earlier this year prior to the weather getting cold. She was admitted after haveing several apparent syncopal episodes this week. She states she did have a fall but dneis syncope. Review of chart sugests family note syncopal episodes ties two today. IN the er, she had reported tacy brady episodes with rates as low as in the 30's and as high as the 120s. She was admitted and has ruled out for an mi. Telemetry reveals sinus rhythm with occasional pvcs and pacs. She dneies any chest pain. She dneis history of syncope in the past.   Physical Exam:  GEN well developed, well nourished, no acute distress   HEENT hearing intact to voice   NECK supple   RESP normal resp effort   CARD Normal, S1, S2  Murmur   Murmur Systolic   Systolic Murmur Out flow   ABD denies tenderness  no hernia  normal BS   LYMPH negative neck, negative axillae   EXTR negative cyanosis/clubbing, negative edema   SKIN normal to palpation   NEURO cranial nerves intact, motor/sensory function intact   PSYCH alert, poor insight   Review of Systems:  Subjective/Chief Complaint no complaints at present. Apparent syncopal episodes   General: No Complaints   Skin: No Complaints   ENT: No Complaints   Eyes: No Complaints   Neck: No Complaints   Respiratory: No Complaints   Cardiovascular: No Complaints   Gastrointestinal: No Complaints   Genitourinary: No Complaints   Vascular: No Complaints   Musculoskeletal: No Complaints   Neurologic: Fainting   Hematologic: No Complaints   Endocrine: No Complaints   Psychiatric: No Complaints   Review of Systems: All other systems were reviewed and found to be negative   Medications/Allergies Reviewed Medications/Allergies reviewed   EKG:  Interpretation sinus rhythm with pacs and appaarent tachy brady episodes.     Penicillin: Itching  ASA: Other   Impression 81 yo female with admission for apparent syncope with hypotension. Currently has rueld out for an mi and is stable but relatively hypotensive. She had reported tacy and brady episodes in the er. Since coming to [facility] has had no signficant pauses or bradycardic episodes. Pt states she had mechanical falls but apparetly had several episodes where she lost concsiousness. Her hypotension may have caused the events vs bradycardia or pauses.   Plan 1. Conitneu to rule out for mi and follow heart rate and hemodynamics 2. Careful hydration. 3. Echo is pending.  4. COnsider ppm if she has further bradycardic episodes.   Electronic Signatures: Teodoro Spray (MD)  (Signed 13-Feb-14 22:07)  Authored: General Aspect/Present Illness, History and Physical Exam, Review of System, EKG , Allergies, Impression/Plan   Last Updated: 13-Feb-14 22:07 by Teodoro Spray (MD)

## 2014-11-02 ENCOUNTER — Encounter: Payer: Self-pay | Admitting: Emergency Medicine

## 2014-11-02 ENCOUNTER — Emergency Department: Payer: Medicare Other

## 2014-11-02 ENCOUNTER — Emergency Department
Admission: EM | Admit: 2014-11-02 | Discharge: 2014-11-02 | Disposition: A | Discharge status: Home / Self Care | Payer: Medicare Other | Attending: Emergency Medicine | Admitting: Emergency Medicine

## 2014-11-02 DIAGNOSIS — Y9389 Activity, other specified: Secondary | ICD-10-CM | POA: Diagnosis not present

## 2014-11-02 DIAGNOSIS — Y998 Other external cause status: Secondary | ICD-10-CM | POA: Insufficient documentation

## 2014-11-02 DIAGNOSIS — Y9289 Other specified places as the place of occurrence of the external cause: Secondary | ICD-10-CM | POA: Insufficient documentation

## 2014-11-02 DIAGNOSIS — S0101XA Laceration without foreign body of scalp, initial encounter: Secondary | ICD-10-CM | POA: Insufficient documentation

## 2014-11-02 DIAGNOSIS — S51011A Laceration without foreign body of right elbow, initial encounter: Secondary | ICD-10-CM

## 2014-11-02 DIAGNOSIS — W01198A Fall on same level from slipping, tripping and stumbling with subsequent striking against other object, initial encounter: Secondary | ICD-10-CM | POA: Diagnosis not present

## 2014-11-02 DIAGNOSIS — S51001A Unspecified open wound of right elbow, initial encounter: Secondary | ICD-10-CM | POA: Diagnosis not present

## 2014-11-02 DIAGNOSIS — IMO0002 Reserved for concepts with insufficient information to code with codable children: Secondary | ICD-10-CM

## 2014-11-02 DIAGNOSIS — S0990XA Unspecified injury of head, initial encounter: Secondary | ICD-10-CM | POA: Diagnosis present

## 2014-11-02 HISTORY — DX: Malignant neoplasm of unspecified site of unspecified female breast: C50.919

## 2014-11-02 HISTORY — DX: Unspecified dementia, unspecified severity, without behavioral disturbance, psychotic disturbance, mood disturbance, and anxiety: F03.90

## 2014-11-02 MED ORDER — LIDOCAINE HCL (PF) 1 % IJ SOLN
5.0000 mL | Freq: Once | INTRAMUSCULAR | Status: DC
Start: 2014-11-02 — End: 2014-11-02
  Filled 2014-11-02: qty 5

## 2014-11-02 MED ORDER — BACITRACIN-NEOMYCIN-POLYMYXIN 400-5-5000 EX OINT
TOPICAL_OINTMENT | CUTANEOUS | Status: AC
  Administered 2014-11-02: 17:00:00 via TOPICAL
  Filled 2014-11-02: qty 1

## 2014-11-02 NOTE — Discharge Instructions (Signed)
The staples will need to be removed in 7-10 days. Please seek medical attention for any high fevers, chest pain, shortness of breath, change in behavior, persistent vomiting, bloody stool or any other new or concerning symptoms. Staple Care and Removal Your caregiver has used staples today to repair your wound. Staples are used to help a wound heal faster by holding the edges of the wound together. The staples can be removed when the wound has healed well enough to stay together after the staples are removed. A dressing (wound covering), depending on the location of the wound, may have been applied. This may be changed once per day or as instructed. If the dressing sticks, it may be soaked off with soapy water or hydrogen peroxide. Only take over-the-counter or prescription medicines for pain, discomfort, or fever as directed by your caregiver.  If you did not receive a tetanus shot today because you did not recall when your last one was given, check with your caregiver when you have your staples removed to determine if one is needed. Return to your caregiver's office in 1 week or as suggested to have your staples removed. SEEK IMMEDIATE MEDICAL CARE IF:   You have redness, swelling, or increasing pain in the wound.  You have pus coming from the wound.  You have a fever.  You notice a bad smell coming from the wound or dressing.  Your wound edges break open after staples have been removed. Document Released: 02/13/2001 Document Revised: 08/13/2011 Document Reviewed: 02/28/2005 [facility] Patient Information 2015 [place], [place]. This information is not intended to replace advice given to you by your health care provider. Make sure you discuss any questions you have with your health care provider.

## 2014-11-02 NOTE — ED Notes (Signed)
Pt fell while getting out of the shower and hit the top of her head. One inch laceration noted to head. Bleeding controlled with dressing. Pt with caregiver at time of fall. No loc.

## 2014-11-02 NOTE — ED Provider Notes (Signed)
[facility] Emergency Department Provider Note    ____________________________________________  Time seen: 1525  I have reviewed the triage vital signs and the nursing notes.   HISTORY  Chief Complaint Fall and Head Laceration   History limited by: Dementia, history obtained from caretaker.   HPI Beth Randall is a 81 y.o. female who presents to the emergency department today after a fall and laceration. Per the caretaker the patient was coming out of the shower when she slipped on some wet ground. The caretaker was able to hold the patient up so she did not fall completely to the ground. However she did hit her head and suffered a laceration above her forehead. Additionally when the caretaker grabbed her the created some skin tears to the right arm. Denies any loss of consciousness.     Past Medical History  Diagnosis Date  . Dementia   . Breast cancer     There are no active problems to display for this patient.   History reviewed. No pertinent past surgical history.  No current outpatient prescriptions on file.  Allergies Review of patient's allergies indicates no known allergies.  No family history on file.  Social History History  Substance Use Topics  . Smoking status: Never Smoker   . Smokeless tobacco: Not on file  . Alcohol Use: No    Review of Systems  Unable to obtain secondary to dementia.  ____________________________________________   PHYSICAL EXAM:  VITAL SIGNS: ED Triage Vitals  Enc Vitals Group     BP 11/02/14 1236 120/61 mmHg     Pulse Rate 11/02/14 1236 79     Resp 11/02/14 1236 18     Temp 11/02/14 1236 97.5 F (36.4 C)     Temp Source 11/02/14 1236 Oral     SpO2 11/02/14 1236 97 %     Weight 11/02/14 1236 124 lb (56.246 kg)     Height 11/02/14 1236 5\' 5"  (1.651 m)   Constitutional: Awake and alert. Well appearing and in no distress. Eyes: Conjunctivae are normal. PERRL. Normal extraocular  movements. ENT   Head: Normocephalic. Roughly 3 cm vertical laceration above forehead. Hemostatic.   Nose: No congestion/rhinnorhea.   Mouth/Throat: Mucous membranes are moist.   Neck: No stridor. No midline tenderness. Hematological/Lymphatic/Immunilogical: No cervical lymphadenopathy. Cardiovascular: Normal rate, regular rhythm.  Respiratory: Normal respiratory effort without tachypnea nor retractions.  Gastrointestinal: Soft and nontender. No distention.  Genitourinary: Deferred Musculoskeletal: Normal range of motion in all extremities. No joint effusions.  No lower extremity tenderness nor edema. Neurologic:  Normal speech and language. No gross focal neurologic deficits are appreciated. Speech is normal.  Skin:  Skin is warm, dry and intact. No rash noted. Psychiatric: Mood and affect are normal. Speech and behavior are normal. Patient exhibits appropriate insight and judgment.  ____________________________________________    LABS (pertinent positives/negatives)  None  ____________________________________________   EKG  None  ____________________________________________    RADIOLOGY  CT head  IMPRESSION: No acute intracranial abnormality.  Atrophy, chronic microvascular disease. ____________________________________________   PROCEDURES  Procedure(s) performed: laceration repair, see procedure note(s).  Critical Care performed: No  LACERATION REPAIR Performed by: Nance Pear Authorized by: Nance Pear Consent: Verbal consent obtained. Risks and benefits: risks, benefits and alternatives were discussed Consent given by: patient Patient identity confirmed: provided demographic data Prepped and Draped in normal sterile fashion Wound explored  Laceration Location: Frontal scalp  Laceration Length: 3cm  No Foreign Bodies seen or palpated  Anesthesia: None  Cleaning: Chlorhexidine   Irrigation method: Jet  Amount of  cleaning: standard  Skin closure: Staples   Number of staples: 3    Patient tolerance: Patient tolerated the procedure well with no immediate complications.  ____________________________________________   INITIAL IMPRESSION / ASSESSMENT AND PLAN / ED COURSE  Pertinent labs & imaging results that were available during my care of the patient were reviewed by me and considered in my medical decision making (see chart for details).  She here after mechanical fall. 7 laceration to forehead and skin tears on right arm. Will have skin tears cleaned and dressed by nurse with [place]. Will staple forehead. CT head without any acute findings.  ____________________________________________   FINAL CLINICAL IMPRESSION(S) / ED DIAGNOSES  Final diagnoses:  Laceration  Skin tear of right elbow without complication, initial encounter     Nance Pear, MD 11/02/14 [id number]

## 2014-11-02 NOTE — ED Notes (Signed)
Wounds on right arm and head staples cleaned with ns.  Neosporin to head and large skin tear on riaght elbow area.   Steri strips to 2 smaller skin tears near elbow.

## 2014-11-11 ENCOUNTER — Emergency Department
Admission: EM | Admit: 2014-11-11 | Discharge: 2014-11-11 | Disposition: A | Discharge status: Home / Self Care | Payer: Medicare Other | Attending: Emergency Medicine | Admitting: Emergency Medicine

## 2014-11-11 DIAGNOSIS — R531 Weakness: Secondary | ICD-10-CM

## 2014-11-11 DIAGNOSIS — F0390 Unspecified dementia without behavioral disturbance: Secondary | ICD-10-CM | POA: Insufficient documentation

## 2014-11-11 LAB — URINALYSIS COMPLETE WITH MICROSCOPIC (ARMC ONLY)
BILIRUBIN URINE: NEGATIVE
Bacteria, UA: NONE SEEN
GLUCOSE, UA: NEGATIVE mg/dL
Hgb urine dipstick: NEGATIVE
Ketones, ur: NEGATIVE mg/dL
Leukocytes, UA: NEGATIVE
Nitrite: NEGATIVE
Protein, ur: NEGATIVE mg/dL
RBC / HPF: NONE SEEN RBC/hpf (ref 0–5)
Specific Gravity, Urine: 1.004 — ABNORMAL LOW (ref 1.005–1.030)
Squamous Epithelial / LPF: NONE SEEN
pH: 7 (ref 5.0–8.0)

## 2014-11-11 LAB — COMPREHENSIVE METABOLIC PANEL
ALK PHOS: 43 U/L (ref 38–126)
ALT: 15 U/L (ref 14–54)
ANION GAP: 5 (ref 5–15)
AST: 19 U/L (ref 15–41)
Albumin: 3.2 g/dL — ABNORMAL LOW (ref 3.5–5.0)
BUN: 15 mg/dL (ref 6–20)
CO2: 30 mmol/L (ref 22–32)
Calcium: 8.6 mg/dL — ABNORMAL LOW (ref 8.9–10.3)
Chloride: 107 mmol/L (ref 101–111)
Creatinine, Ser: 0.77 mg/dL (ref 0.44–1.00)
GFR calc Af Amer: >60 mL/min (ref >60)
Glucose, Bld: 97 mg/dL (ref 65–99)
Potassium: 3.9 mmol/L (ref 3.5–5.1)
Sodium: 142 mmol/L (ref 135–145)
TOTAL PROTEIN: 5.7 g/dL — AB (ref 6.5–8.1)
Total Bilirubin: 0.6 mg/dL (ref 0.3–1.2)

## 2014-11-11 LAB — CBC
HEMATOCRIT: 35.9 % (ref 35.0–47.0)
HEMOGLOBIN: 11.8 g/dL — AB (ref 12.0–16.0)
MCH: 30.5 pg (ref 26.0–34.0)
MCHC: 32.8 g/dL (ref 32.0–36.0)
MCV: 92.8 fL (ref 80.0–100.0)
PLATELETS: 198 10*3/uL (ref 150–440)
RBC: 3.86 MIL/uL (ref 3.80–5.20)
RDW: 13.1 % (ref 11.5–14.5)
WBC: 7.5 10*3/uL (ref 3.6–11.0)

## 2014-11-11 MED ORDER — SODIUM CHLORIDE 0.9 % IV BOLUS (SEPSIS)
1000.0000 mL | Freq: Once | INTRAVENOUS | Status: AC
Start: 2014-11-11 — End: 2014-11-11
  Administered 2014-11-11: 1000 mL via INTRAVENOUS

## 2014-11-11 NOTE — Discharge Instructions (Signed)

## 2014-11-11 NOTE — ED Notes (Signed)
Brought to ed [facility] for evaluation of weakness. Received A&O*2 speaking full sentences. Denies pain/discomfort. Denies SOB. Awaiting further evaluation.

## 2014-11-11 NOTE — ED Notes (Signed)
Pt assisted to toilet to provide a urine sample for UA.

## 2014-11-11 NOTE — ED Provider Notes (Signed)
[facility] Emergency Department Provider Note  ____________________________________________  Time seen: 2:20 AM  I have reviewed the triage vital signs and the nursing notes.   HISTORY  Chief Complaint Weakness      HPI Beth Randall is a 81 y.o. female presents with generalized weakness times today. Patient's son states that she's had very poor liquid by mouth intake. Patient denies vomiting no diarrhea no fever. Patient has no complaints at present denies any pain.Patient's son simply states that he believes is dehydrated.   Past Medical History  Diagnosis Date  . Dementia   . Breast cancer     There are no active problems to display for this patient.   No past surgical history on file.  No current outpatient prescriptions on file.  Allergies Review of patient's allergies indicates no known allergies.  No family history on file.  Social History History  Substance Use Topics  . Smoking status: Never Smoker   . Smokeless tobacco: Not on file  . Alcohol Use: No    Review of Systems  Constitutional: Negative for fever. Eyes: Negative for visual changes. ENT: Negative for sore throat. Cardiovascular: Negative for chest pain. Respiratory: Negative for shortness of breath. Gastrointestinal: Negative for abdominal pain, vomiting and diarrhea. Genitourinary: Negative for dysuria. Musculoskeletal: Negative for back pain. Skin: Negative for rash. Neurological: Negative for headaches, focal weakness or numbness. Positive for generalized weakness   10-point ROS otherwise negative.  ____________________________________________   PHYSICAL EXAM:  VITAL SIGNS: ED Triage Vitals  Enc Vitals Group     BP 11/11/14 0223 134/72 mmHg     Pulse Rate 11/11/14 0223 76     Resp 11/11/14 0223 16     Temp 11/11/14 0223 98 F (36.7 C)     Temp Source 11/11/14 0223 Oral     SpO2 11/11/14 0219 99 %     Weight 11/11/14 0223 121 lb 4.1 oz (55  kg)     Height 11/11/14 0223 5\' 2"  (1.575 m)     Head Cir --      Peak Flow --      Pain Score 11/11/14 0224 0     Pain Loc --      Pain Edu? --      Excl. in [place]? --      Constitutional: Alert and oriented. Well appearing and in no distress. Eyes: Conjunctivae are normal. PERRL. Normal extraocular movements. ENT   Head: Normocephalic and atraumatic.   Nose: No congestion/rhinnorhea.   Mouth/Throat: Mucous membranes are moist.   Neck: No stridor. Cardiovascular: Normal rate, regular rhythm. Normal and symmetric distal pulses are present in all extremities. No murmurs, rubs, or gallops. Respiratory: Normal respiratory effort without tachypnea nor retractions. Breath sounds are clear and equal bilaterally. No wheezes/rales/rhonchi. Gastrointestinal: Soft and nontender. No distention. There is no CVA tenderness. Genitourinary: deferred Musculoskeletal: Nontender with normal range of motion in all extremities. No joint effusions.  No lower extremity tenderness nor edema. Neurologic:  Normal speech and language. No gross focal neurologic deficits are appreciated. Speech is normal.  Skin:  Skin is warm, dry and intact. No rash noted. Psychiatric: Mood and affect are normal. Speech and behavior are normal. Patient exhibits appropriate insight and judgment.  ____________________________________________    LABS (pertinent positives/negatives)  Labs Reviewed  CBC - Abnormal; Notable for the following:    Hemoglobin 11.8 (*)    All other components within normal limits  COMPREHENSIVE METABOLIC PANEL - Abnormal; Notable for the following:  Calcium 8.6 (*)    Total Protein 5.7 (*)    Albumin 3.2 (*)    All other components within normal limits  URINALYSIS COMPLETEWITH MICROSCOPIC ([facility] ONLY)     ____________________________________________   EKG   Date: 11/11/2014  Rate: 69  Rhythm: normal sinus rhythm  QRS Axis: normal  Intervals: normal  ST/T Wave  abnormalities: normal  Conduction Disutrbances: none  Narrative Interpretation: unremarkable      ____________________________________________   INITIAL IMPRESSION / ASSESSMENT AND PLAN / ED COURSE  Pertinent labs & imaging results that were available during my care of the patient were reviewed by me and considered in my medical decision making (see chart for details).  History of physical exam consistent with possible dehydration as such patient received 1 L normal saline in emergency department. Lab data unremarkable  ____________________________________________   FINAL CLINICAL IMPRESSION(S) / ED DIAGNOSES  Final diagnoses:  Weakness generalized      Gregor Hams, MD 11/11/14 [id number]

## 2014-12-25 ENCOUNTER — Emergency Department: Payer: Medicare Other

## 2014-12-25 ENCOUNTER — Emergency Department
Admission: EM | Admit: 2014-12-25 | Discharge: 2014-12-25 | Disposition: A | Discharge status: Home / Self Care | Payer: Medicare Other | Attending: Emergency Medicine | Admitting: Emergency Medicine

## 2014-12-25 DIAGNOSIS — S0990XA Unspecified injury of head, initial encounter: Secondary | ICD-10-CM | POA: Diagnosis present

## 2014-12-25 DIAGNOSIS — Y9389 Activity, other specified: Secondary | ICD-10-CM | POA: Insufficient documentation

## 2014-12-25 DIAGNOSIS — Y998 Other external cause status: Secondary | ICD-10-CM | POA: Diagnosis not present

## 2014-12-25 DIAGNOSIS — W109XXA Fall (on) (from) unspecified stairs and steps, initial encounter: Secondary | ICD-10-CM | POA: Insufficient documentation

## 2014-12-25 DIAGNOSIS — F0390 Unspecified dementia without behavioral disturbance: Secondary | ICD-10-CM | POA: Diagnosis not present

## 2014-12-25 DIAGNOSIS — W19XXXA Unspecified fall, initial encounter: Secondary | ICD-10-CM

## 2014-12-25 DIAGNOSIS — S61402A Unspecified open wound of left hand, initial encounter: Secondary | ICD-10-CM | POA: Insufficient documentation

## 2014-12-25 DIAGNOSIS — Y9289 Other specified places as the place of occurrence of the external cause: Secondary | ICD-10-CM | POA: Diagnosis not present

## 2014-12-25 DIAGNOSIS — S0101XA Laceration without foreign body of scalp, initial encounter: Secondary | ICD-10-CM | POA: Diagnosis not present

## 2014-12-25 DIAGNOSIS — S61412A Laceration without foreign body of left hand, initial encounter: Secondary | ICD-10-CM

## 2014-12-25 DIAGNOSIS — Z23 Encounter for immunization: Secondary | ICD-10-CM | POA: Diagnosis not present

## 2014-12-25 DIAGNOSIS — Z880 Allergy status to penicillin: Secondary | ICD-10-CM | POA: Insufficient documentation

## 2014-12-25 LAB — CBC
HCT: 36.7 % (ref 35.0–47.0)
Hemoglobin: 12.2 g/dL (ref 12.0–16.0)
MCH: 30.6 pg (ref 26.0–34.0)
MCHC: 33.3 g/dL (ref 32.0–36.0)
MCV: 92 fL (ref 80.0–100.0)
PLATELETS: 214 10*3/uL (ref 150–440)
RBC: 3.99 MIL/uL (ref 3.80–5.20)
RDW: 13.2 % (ref 11.5–14.5)
WBC: 10.3 10*3/uL (ref 3.6–11.0)

## 2014-12-25 LAB — BASIC METABOLIC PANEL
Anion gap: 7 (ref 5–15)
BUN: 23 mg/dL — AB (ref 6–20)
CHLORIDE: 102 mmol/L (ref 101–111)
CO2: 28 mmol/L (ref 22–32)
CREATININE: 0.88 mg/dL (ref 0.44–1.00)
Calcium: 8.5 mg/dL — ABNORMAL LOW (ref 8.9–10.3)
GFR calc Af Amer: >60 mL/min (ref >60)
GFR calc non Af Amer: 57 mL/min — ABNORMAL LOW (ref >60)
GLUCOSE: 116 mg/dL — AB (ref 65–99)
POTASSIUM: 3.7 mmol/L (ref 3.5–5.1)
Sodium: 137 mmol/L (ref 135–145)

## 2014-12-25 MED ORDER — LIDOCAINE-EPINEPHRINE (PF) 2 %-1:200000 IJ SOLN
20.0000 mL | Freq: Once | INTRAMUSCULAR | Status: DC
  Filled 2014-12-25: qty 20

## 2014-12-25 MED ORDER — TETANUS-DIPHTH-ACELL PERTUSSIS 5-2.5-18.5 LF-MCG/0.5 IM SUSP
0.5000 mL | Freq: Once | INTRAMUSCULAR | Status: AC
  Administered 2014-12-25: 0.5 mL via INTRAMUSCULAR
  Filled 2014-12-25: qty 0.5

## 2014-12-25 NOTE — ED Notes (Signed)
Pt arrived via ems from home, pt going up steps and fell down 2 steps, pt hit the back of her head, pt has a lac to the back of her head and abrasions to her upper back and shoulder

## 2014-12-25 NOTE — ED Provider Notes (Signed)
[facility] Emergency Department Provider Note  ____________________________________________  Time seen: Approximately 5:38 PM  I have reviewed the triage vital signs and the nursing notes.  EM caveat: Patient has dementia and very poor recall of any events leading up to visit  HISTORY  Chief Complaint Fall   HPI Beth Randall is a 81 y.o. female who has a history of dementia. She was evidently walking up steps today when she got to the second or third step and fell backwards. She fell and struck the back of her head without loss of consciousness. The patient does not recall the event and when asked says that she was playing tennis and that she must have slipped and fell. She denies being in any pain. She states that she knows she is at the hospital. She is not able to recall the events of today but does deny any pain except for some tenderness over the back of her head.  No chest pain or trouble breathing. Describes an achy dull pain over the right back of her head. She describes as mild. Denies neck pain.    Past Medical History  Diagnosis Date  . Dementia   . Breast cancer     There are no active problems to display for this patient.   No past surgical history on file.  No current outpatient prescriptions on file.  Allergies Penicillins  No family history on file.  Social History History  Substance Use Topics  . Smoking status: Never Smoker   . Smokeless tobacco: Not on file  . Alcohol Use: No    Review of Systems again, this is not likely 100% reliable as the patient has dementia Constitutional: No fever/chills Eyes: No visual changes. ENT: No sore throat. Cardiovascular: Denies chest pain. Respiratory: Denies shortness of breath. Gastrointestinal: No abdominal pain.   No constipation. Musculoskeletal: Negative for back pain. No neck pain. No pain in her hips or legs. Skin: Negative for rash. Neurological: Negative for headaches,  focal weakness or numbness.  10-point ROS otherwise negative.  ____________________________________________   PHYSICAL EXAM:  VITAL SIGNS: Filed Vitals:   12/25/14 1740  BP: 138/85  Pulse: 85  Temp: 98.2 F (36.8 C)  Resp: 16   Afebrile.                                                               Constitutional: Alert and oriented to self and place but not to year or date. Well appearing and in no acute distress. She is very pleasant. Eyes: Conjunctivae are normal. PERRL. EOMI. Head: Atraumatic except for a moderate hematoma over the right posterior parietal lobe. Bleeding is controlled. Nose: No congestion/rhinnorhea. Mouth/Throat: Mucous membranes are moist.  Oropharynx non-erythematous. Neck: No stridor.  No cervical thoracic or lumbar spine tenderness. Patient log rolled off the backboard. C-collar maintained. Cardiovascular: Normal rate, regular rhythm. Grossly normal heart sounds.  Good peripheral circulation. Respiratory: Normal respiratory effort.  No retractions. Lungs CTAB. Gastrointestinal: Soft and nontender. No distention. No abdominal bruits. No CVA tenderness. Musculoskeletal: No lower extremity tenderness nor edema.  No joint effusions. Good and full range of motion of hips and legs bilaterally with no pain with flexion extension or axial loading of any major joints. She does have 2 small skin abrasions  and a small skin tear over her left palm. CMS intact in all extremities. Patient does have some mild tenderness over the left pelvic rim, but no evidence of obvious injury. Neurologic:  Normal speech and language. No gross focal neurologic deficits are appreciated. Moves all extremities commands. No numbness or deficits noted in any of the extremities.  Skin:  Skin is warm, dry and intact. No rash noted. There is a slight ecchymosis over the right anterior shoulder but full range of motion without deformity or pain. Psychiatric: Mood and affect are  normal. Speech and behavior are normal. Calm and appropriate.  ____________________________________________   LABS (all labs ordered are listed, but only abnormal results are displayed)  Labs Reviewed  BASIC METABOLIC PANEL - Abnormal; Notable for the following:    Glucose, Bld 116 (*)    BUN 23 (*)    Calcium 8.5 (*)    GFR calc non Af Amer 57 (*)    All other components within normal limits  CBC  CBG MONITORING, ED   ____________________________________________  EKG  Reviewed and interpreted by me Normal sinus rhythm at a rate of 80 beats a minute There are Q waves noted in inferior distribution as well as in septal distribution. There is a incomplete right bundle branch block with associated T-wave inversions in V1 and V2. There is no acute ST elevation or T-wave depression suggesting any acute ischemia.  PR 190 QRS 100 QTc 450  Impression sinus rhythm without evidence of acute ischemic change, however there is evidence of possible old infarct. ____________________________________________  RADIOLOGY  DG HIP UNILAT WITH PELVIS 2-3 VIEWS LEFT (Final result) Result time: 12/25/14 19:39:51   Final result by Rad Results In Interface (12/25/14 19:39:51)   Narrative:   CLINICAL DATA: Pain after falling earlier today  EXAM: DG HIP (WITH OR WITHOUT PELVIS) 2-3V LEFT  COMPARISON: None.  FINDINGS: Frontal pelvis as well as frontal and lateral left hip images were obtained. There is no demonstrable fracture or dislocation. There is moderate osteoarthritic change in each hip joint. No erosive change. There is postoperative change in the right lower quadrant region.  IMPRESSION: Moderate symmetric narrowing both hip joints. No fracture or dislocation.   Electronically Signed By: Lowella Grip III M.D. On: 12/25/2014 19:39          CT Head Wo Contrast (Final result) Result time: 12/25/14 19:34:25   Final result by Rad Results In Interface (12/25/14  19:34:25)   Narrative:   CLINICAL DATA: Fall from 5 steps today. Right posterior head hematoma/ laceration. No reported loss of consciousness. Initial encounter.  EXAM: CT HEAD WITHOUT CONTRAST  CT CERVICAL SPINE WITHOUT CONTRAST  TECHNIQUE: Multidetector CT imaging of the head and cervical spine was performed following the standard protocol without intravenous contrast. Multiplanar CT image reconstructions of the cervical spine were also generated.  COMPARISON: Head CT 11/02/2014  FINDINGS: CT HEAD FINDINGS  There is unchanged, mild generalized cerebral atrophy. Periventricular white-matter hypodensities are similar to the prior study and nonspecific but compatible with mild chronic small vessel ischemic disease. There is no evidence of acute cortical infarct, intracranial hemorrhage, mass, midline shift, or extra-axial fluid collection.  Visualized orbits are unremarkable. There is a small right parietal scalp hematoma. Postsurgical changes are partially visualized in the sinuses with evidence of prior left ethmoidectomy and left maxillary antrostomy. Visualized paranasal sinuses and mastoid air cells are clear. No skull fracture is identified. Mild bilateral carotid siphon calcification is noted.  CT CERVICAL SPINE FINDINGS  Study is mildly motion degraded. Vertebral alignment is normal. No prevertebral soft tissue edema is seen. No acute cervical spine fracture is identified. There are very mild superior endplate compression fracture deformities at T1 and T2 with at most 10-15% height loss. There is mild sclerosis subjacent to the superior endplates, and there is some irregularity with questionable fracture lines extending through the anterior corners of the superior endplates at both levels.  Mild disc space narrowing and degenerative endplate spurring are present at C6-7. Mild uncovertebral spurring is noted more proximally in the cervical spine as well.  There is mild spinal stenosis at C6-7 due to a broad central disc protrusion. Pleural-parenchymal scarring is present in the right greater than left lung apices, incompletely visualized.  IMPRESSION: 1. No evidence of acute intracranial abnormality. 2. Mild cerebral atrophy and mild chronic small vessel ischemic disease. 3. Small right parietal scalp hematoma. 4. Mildly motion degraded cervical spine CT without acute cervical spine fracture identified. 5. Very mild T1 and T2 superior endplate compression fractures of indeterminate age.   Electronically Signed By: Logan Bores On: 12/25/2014 19:34          CT Cervical Spine Wo Contrast (Final result) Result time: 12/25/14 19:34:25   Final result by Rad Results In Interface (12/25/14 19:34:25)   Narrative:   CLINICAL DATA: Fall from 5 steps today. Right posterior head hematoma/ laceration. No reported loss of consciousness. Initial encounter.  EXAM: CT HEAD WITHOUT CONTRAST  CT CERVICAL SPINE WITHOUT CONTRAST  TECHNIQUE: Multidetector CT imaging of the head and cervical spine was performed following the standard protocol without intravenous contrast. Multiplanar CT image reconstructions of the cervical spine were also generated.  COMPARISON: Head CT 11/02/2014  FINDINGS: CT HEAD FINDINGS  There is unchanged, mild generalized cerebral atrophy. Periventricular white-matter hypodensities are similar to the prior study and nonspecific but compatible with mild chronic small vessel ischemic disease. There is no evidence of acute cortical infarct, intracranial hemorrhage, mass, midline shift, or extra-axial fluid collection.  Visualized orbits are unremarkable. There is a small right parietal scalp hematoma. Postsurgical changes are partially visualized in the sinuses with evidence of prior left ethmoidectomy and left maxillary antrostomy. Visualized paranasal sinuses and mastoid air cells are clear. No  skull fracture is identified. Mild bilateral carotid siphon calcification is noted.     ____________________________________________  ----------------------------------------- 6:33 PM on 12/25/2014 -----------------------------------------  LACERATION REPAIR Performed by: Delman Kitten Authorized by: Delman Kitten Consent: Verbal consent obtained. Risks and benefits: risks, benefits and alternatives were discussed Consent given by: patient Patient identity confirmed: provided demographic data Prepped and Draped in normal sterile fashion Wound explored. No galea injury visualized. No foreign body visualized. Complex stellate-appearing wound.  Laceration Location: Right parietal scalp  Laceration Length: 3 cm  No Foreign Bodies seen or palpated  Anesthesia: local infiltration  Local anesthetic: lidocaine 2% with epinephrine  Anesthetic total: 3 ml  Irrigation method: syringe Amount of cleaning: standard  Skin closure: 4-0 proline   Number of sutures: 6   Technique: Stellate laceration repair with 1 suture involving multiple throws to pull wound together, 5 simple interrupted   Patient tolerance: Patient tolerated the procedure well with no immediate complications.   PROCEDURES  Procedure(s) performed: Laceration  Critical Care performed: No  ____________________________________________   INITIAL IMPRESSION / ASSESSMENT AND PLAN / ED COURSE  Pertinent labs & imaging results that were available during my care of the patient were reviewed by me and considered in my medical decision making (see chart for  details).  Patient's son arrived and the fall was witnessed by a family member and she was walking upstairs and lost balance likely due to caring a newspaper. She fell backwards striking her head but did not lose consciousness and was awake and alert thereafter. This is believed to be a simple fall from having tripped and she does have a history of balance issues due  to age. Her son states that she is acting and behaving normally as she does and that she and her husband have been living in her home for about 50+ years now but with significant family support because of dementia.  ----------------------------------------- 7:10 PM on 12/25/2014 -----------------------------------------  Patient remained stable awake and alert without any evidence of head injury by symptomatology. Her son is attentive at the bedside with the patient. He discussed follow-up care including follow-up for suture removal in about 10-12 days. We also discussed skin care, suture care, and head injury precautions.   ____________________________________________   FINAL CLINICAL IMPRESSION(S) / ED DIAGNOSES  Final diagnoses:  Fall  Scalp laceration, initial encounter  Skin tear of hand without complication, left, initial encounter      Delman Kitten, MD 12/25/14 2104

## 2014-12-25 NOTE — Discharge Instructions (Signed)
You were seen in the Emergency Department (ED) today for a head injury.  Based on your evaluation, you may have sustained a concussion (or bruise) to your brain.  If you had a CT scan done, it did not show any evidence of serious injury or bleeding.    Symptoms to expect from a concussion include nausea, mild to moderate headache, difficulty concentrating or sleeping, and mild lightheadedness.  These symptoms should improve over the next few days to weeks, but it may take many weeks before you feel back to normal.  Return to the emergency department or follow-up with your primary care doctor if your symptoms are not improving over this time.  Signs of a more serious head injury include vomiting, severe headache, excessive sleepiness or confusion, and weakness or numbness in your face, arms or legs.  Return immediately to the Emergency Department if you experience any of these more concerning symptoms.      Skin Tear Care A skin tear is a wound in which the top layer of skin has peeled off. This is a common problem with aging because the skin becomes thinner and more fragile as a person gets older. In addition, some medicines, such as oral corticosteroids, can lead to skin thinning if taken for long periods of time.  A skin tear is often repaired with tape or skin adhesive strips. This keeps the skin that has been peeled off in contact with the healthier skin beneath. Depending on the location of the wound, a bandage (dressing) may be applied over the tape or skin adhesive strips. Sometimes, during the healing process, the skin turns black and dies. Even when this happens, the torn skin acts as a good dressing until the skin underneath gets healthier and repairs itself. HOME CARE INSTRUCTIONS   Change dressings once per day or as directed by your caregiver.  Gently clean the skin tear and the area around the tear using saline solution or mild soap and water.  Do not rub the injured skin dry. Let the  area air dry.  Apply petroleum jelly or an antibiotic cream or ointment to keep the tear moist. This will help the wound heal. Do not allow a scab to form.  If the dressing sticks before the next dressing change, moisten it with warm soapy water and gently remove it.  Protect the injured skin until it has healed.  Only take over-the-counter or prescription medicines as directed by your caregiver.  Take showers or baths using warm soapy water. Apply a new dressing after the shower or bath.  Keep all follow-up appointments as directed by your caregiver.  SEEK IMMEDIATE MEDICAL CARE IF:   You have redness, swelling, or increasing pain in the skin tear.  You havepus coming from the skin tear.  You have chills.  You have a red streak that goes away from the skin tear.  You have a bad smell coming from the tear or dressing.  You have a fever or persistent symptoms for more than 2-3 days.  You have a fever and your symptoms suddenly get worse. MAKE SURE YOU:  Understand these instructions.  Will watch this condition.  Will get help right away if your child is not doing well or gets worse. Document Released: 02/13/2001 Document Revised: 02/13/2012 Document Reviewed: 12/03/2011 [facility] Patient Information 2015 [place], [place]. This information is not intended to replace advice given to you by your health care provider. Make sure you discuss any questions you have with your health  care provider.  Fall Prevention and Home Safety Falls cause injuries and can affect all age groups. It is possible to use preventive measures to significantly decrease the likelihood of falls. There are many simple measures which can make your home safer and prevent falls. OUTDOORS  Repair cracks and edges of walkways and driveways.  Remove high doorway thresholds.  Trim shrubbery on the main path into your home.  Have good outside lighting.  Clear walkways of tools, rocks, debris, and  clutter.  Check that handrails are not broken and are securely fastened. Both sides of steps should have handrails.  Have leaves, snow, and ice cleared regularly.  Use sand or salt on walkways during winter months.  In the garage, clean up grease or oil spills. BATHROOM  Install night lights.  Install grab bars by the toilet and in the tub and shower.  Use non-skid mats or decals in the tub or shower.  Place a plastic non-slip stool in the shower to sit on, if needed.  Keep floors dry and clean up all water on the floor immediately.  Remove soap buildup in the tub or shower on a regular basis.  Secure bath mats with non-slip, double-sided rug tape.  Remove throw rugs and tripping hazards from the floors. BEDROOMS  Install night lights.  Make sure a bedside light is easy to reach.  Do not use oversized bedding.  Keep a telephone by your bedside.  Have a firm chair with side arms to use for getting dressed.  Remove throw rugs and tripping hazards from the floor. KITCHEN  Keep handles on pots and pans turned toward the center of the stove. Use back burners when possible.  Clean up spills quickly and allow time for drying.  Avoid walking on wet floors.  Avoid hot utensils and knives.  Position shelves so they are not too high or low.  Place commonly used objects within easy reach.  If necessary, use a sturdy step stool with a grab bar when reaching.  Keep electrical cables out of the way.  Do not use floor polish or wax that makes floors slippery. If you must use wax, use non-skid floor wax.  Remove throw rugs and tripping hazards from the floor. STAIRWAYS  Never leave objects on stairs.  Place handrails on both sides of stairways and use them. Fix any loose handrails. Make sure handrails on both sides of the stairways are as long as the stairs.  Check carpeting to make sure it is firmly attached along stairs. Make repairs to worn or loose carpet  promptly.  Avoid placing throw rugs at the top or bottom of stairways, or properly secure the rug with carpet tape to prevent slippage. Get rid of throw rugs, if possible.  Have an electrician put in a light switch at the top and bottom of the stairs. OTHER FALL PREVENTION TIPS  Wear low-heel or rubber-soled shoes that are supportive and fit well. Wear closed toe shoes.  When using a stepladder, make sure it is fully opened and both spreaders are firmly locked. Do not climb a closed stepladder.  Add color or contrast paint or tape to grab bars and handrails in your home. Place contrasting color strips on first and last steps.  Learn and use mobility aids as needed. Install an electrical emergency response system.  Turn on lights to avoid dark areas. Replace light bulbs that burn out immediately. Get light switches that glow.  Arrange furniture to create clear pathways. Keep furniture  in the same place.  Firmly attach carpet with non-skid or double-sided tape.  Eliminate uneven floor surfaces.  Select a carpet pattern that does not visually hide the edge of steps.  Be aware of all pets. OTHER HOME SAFETY TIPS  Set the water temperature for 120 F (48.8 C).  Keep emergency numbers on or near the telephone.  Keep smoke detectors on every level of the home and near sleeping areas. Document Released: 05/11/2002 Document Revised: 11/20/2011 Document Reviewed: 08/10/2011 [facility] Patient Information 2015 [place], [place]. This information is not intended to replace advice given to you by your health care provider. Make sure you discuss any questions you have with your health care provider.  Head Injury You have received a head injury. It does not appear serious at this time. Headaches and vomiting are common following head injury. It should be easy to awaken from sleeping. Sometimes it is necessary for you to stay in the emergency department for a while for observation. Sometimes  admission to the hospital may be needed. After injuries such as yours, most problems occur within the first 24 hours, but side effects may occur up to 7-10 days after the injury. It is important for you to carefully monitor your condition and contact your health care provider or seek immediate medical care if there is a change in your condition. WHAT ARE THE TYPES OF HEAD INJURIES? Head injuries can be as minor as a bump. Some head injuries can be more severe. More severe head injuries include:  A jarring injury to the brain (concussion).  A bruise of the brain (contusion). This mean there is bleeding in the brain that can cause swelling.  A cracked skull (skull fracture).  Bleeding in the brain that collects, clots, and forms a bump (hematoma). WHAT CAUSES A HEAD INJURY? A serious head injury is most likely to happen to someone who is in a car wreck and is not wearing a seat belt. Other causes of major head injuries include bicycle or motorcycle accidents, sports injuries, and falls. HOW ARE HEAD INJURIES DIAGNOSED? A complete history of the event leading to the injury and your current symptoms will be helpful in diagnosing head injuries. Many times, pictures of the brain, such as CT or MRI are needed to see the extent of the injury. Often, an overnight hospital stay is necessary for observation.  WHEN SHOULD I SEEK IMMEDIATE MEDICAL CARE?  You should get help right away if:  You have confusion or drowsiness.  You feel sick to your stomach (nauseous) or have continued, forceful vomiting.  You have dizziness or unsteadiness that is getting worse.  You have severe, continued headaches not relieved by medicine. Only take over-the-counter or prescription medicines for pain, fever, or discomfort as directed by your health care provider.  You do not have normal function of the arms or legs or are unable to walk.  You notice changes in the black spots in the center of the colored part of your  eye (pupil).  You have a clear or bloody fluid coming from your nose or ears.  You have a loss of vision. During the next 24 hours after the injury, you must stay with someone who can watch you for the warning signs. This person should contact local emergency services (911 in the [place] if you have seizures, you become unconscious, or you are unable to wake up. HOW CAN I PREVENT A HEAD INJURY IN THE FUTURE? The most important factor for preventing major head  injuries is avoiding motor vehicle accidents. To minimize the potential for damage to your head, it is crucial to wear seat belts while riding in motor vehicles. Wearing helmets while bike riding and playing collision sports (like football) is also helpful. Also, avoiding dangerous activities around the house will further help reduce your risk of head injury.  WHEN CAN I RETURN TO NORMAL ACTIVITIES AND ATHLETICS? You should be reevaluated by your health care provider before returning to these activities. If you have any of the following symptoms, you should not return to activities or contact sports until 1 week after the symptoms have stopped:  Persistent headache.  Dizziness or vertigo.  Poor attention and concentration.  Confusion.  Memory problems.  Nausea or vomiting.  Fatigue or tire easily.  Irritability.  Intolerant of bright lights or loud noises.  Anxiety or depression.  Disturbed sleep. MAKE SURE YOU:   Understand these instructions.  Will watch your condition.  Will get help right away if you are not doing well or get worse. Document Released: 05/21/2005 Document Revised: 05/26/2013 Document Reviewed: 01/26/2013 [facility] Patient Information 2015 [place], [place]. This information is not intended to replace advice given to you by your health care provider. Make sure you discuss any questions you have with your health care provider.  Laceration Care, Adult A laceration is a cut that goes through all layers of  the skin. The cut goes into the tissue beneath the skin. HOME CARE For stitches (sutures) or staples:  Keep the cut clean and dry.  If you have a bandage (dressing), change it at least once a day. Change the bandage if it gets wet or dirty, or as told by your doctor.  Wash the cut with soap and water 2 times a day. Rinse the cut with water. Pat it dry with a clean towel.  Put a thin layer of medicated cream on the cut as told by your doctor.  You may shower after the first 24 hours. Do not soak the cut in water until the stitches are removed.  Only take medicines as told by your doctor.  Have your stitches or staples removed as told by your doctor. For skin adhesive strips:  Keep the cut clean and dry.  Do not get the strips wet. You may take a bath, but be careful to keep the cut dry.  If the cut gets wet, pat it dry with a clean towel.  The strips will fall off on their own. Do not remove the strips that are still stuck to the cut. For wound glue:  You may shower or take baths. Do not soak or scrub the cut. Do not swim. Avoid heavy sweating until the glue falls off on its own. After a shower or bath, pat the cut dry with a clean towel.  Do not put medicine on your cut until the glue falls off.  If you have a bandage, do not put tape over the glue.  Avoid lots of sunlight or tanning lamps until the glue falls off. Put sunscreen on the cut for the first year to reduce your scar.  The glue will fall off on its own. Do not pick at the glue. You may need a tetanus shot if:  You cannot remember when you had your last tetanus shot.  You have never had a tetanus shot. If you need a tetanus shot and you choose not to have one, you may get tetanus. Sickness from tetanus can be serious. GET HELP  RIGHT AWAY IF:   Your pain does not get better with medicine.  Your arm, hand, leg, or foot loses feeling (numbness) or changes color.  Your cut is bleeding.  Your joint feels weak,  or you cannot use your joint.  You have painful lumps on your body.  Your cut is red, puffy (swollen), or painful.  You have a red line on the skin near the cut.  You have yellowish-white fluid (pus) coming from the cut.  You have a fever.  You have a bad smell coming from the cut or bandage.  Your cut breaks open before or after stitches are removed.  You notice something coming out of the cut, such as wood or glass.  You cannot move a finger or toe. MAKE SURE YOU:   Understand these instructions.  Will watch your condition.  Will get help right away if you are not doing well or get worse. Document Released: 11/07/2007 Document Revised: 08/13/2011 Document Reviewed: 11/14/2010 [facility] Patient Information 2015 [place], [place]. This information is not intended to replace advice given to you by your health care provider. Make sure you discuss any questions you have with your health care provider.

## 2015-12-21 DIAGNOSIS — F411 Generalized anxiety disorder: Secondary | ICD-10-CM | POA: Insufficient documentation

## 2015-12-21 DIAGNOSIS — F03918 Unspecified dementia, unspecified severity, with other behavioral disturbance: Secondary | ICD-10-CM | POA: Insufficient documentation

## 2015-12-21 DIAGNOSIS — R269 Unspecified abnormalities of gait and mobility: Secondary | ICD-10-CM | POA: Insufficient documentation

## 2015-12-21 DIAGNOSIS — F0391 Unspecified dementia with behavioral disturbance: Secondary | ICD-10-CM | POA: Insufficient documentation

## 2015-12-21 DIAGNOSIS — R2689 Other abnormalities of gait and mobility: Secondary | ICD-10-CM | POA: Insufficient documentation

## 2015-12-21 DIAGNOSIS — Z9181 History of falling: Secondary | ICD-10-CM | POA: Insufficient documentation

## 2016-01-02 ENCOUNTER — Emergency Department: Payer: Medicare Other

## 2016-01-02 ENCOUNTER — Emergency Department
Admission: EM | Admit: 2016-01-02 | Discharge: 2016-01-02 | Disposition: A | Discharge status: Home / Self Care | Payer: Medicare Other | Attending: Emergency Medicine | Admitting: Emergency Medicine

## 2016-01-02 DIAGNOSIS — S0181XA Laceration without foreign body of other part of head, initial encounter: Secondary | ICD-10-CM | POA: Insufficient documentation

## 2016-01-02 DIAGNOSIS — T148XXA Other injury of unspecified body region, initial encounter: Secondary | ICD-10-CM

## 2016-01-02 DIAGNOSIS — Z23 Encounter for immunization: Secondary | ICD-10-CM | POA: Insufficient documentation

## 2016-01-02 DIAGNOSIS — S01112A Laceration without foreign body of left eyelid and periocular area, initial encounter: Secondary | ICD-10-CM | POA: Insufficient documentation

## 2016-01-02 DIAGNOSIS — W19XXXA Unspecified fall, initial encounter: Secondary | ICD-10-CM

## 2016-01-02 DIAGNOSIS — S61412A Laceration without foreign body of left hand, initial encounter: Secondary | ICD-10-CM | POA: Diagnosis not present

## 2016-01-02 DIAGNOSIS — W1839XA Other fall on same level, initial encounter: Secondary | ICD-10-CM | POA: Insufficient documentation

## 2016-01-02 DIAGNOSIS — Y9389 Activity, other specified: Secondary | ICD-10-CM | POA: Insufficient documentation

## 2016-01-02 DIAGNOSIS — S7002XA Contusion of left hip, initial encounter: Secondary | ICD-10-CM | POA: Diagnosis not present

## 2016-01-02 DIAGNOSIS — Y92009 Unspecified place in unspecified non-institutional (private) residence as the place of occurrence of the external cause: Secondary | ICD-10-CM | POA: Diagnosis not present

## 2016-01-02 DIAGNOSIS — Y999 Unspecified external cause status: Secondary | ICD-10-CM | POA: Insufficient documentation

## 2016-01-02 DIAGNOSIS — S0990XA Unspecified injury of head, initial encounter: Secondary | ICD-10-CM

## 2016-01-02 DIAGNOSIS — Z853 Personal history of malignant neoplasm of breast: Secondary | ICD-10-CM | POA: Diagnosis not present

## 2016-01-02 DIAGNOSIS — S0101XA Laceration without foreign body of scalp, initial encounter: Secondary | ICD-10-CM

## 2016-01-02 MED ORDER — BACITRACIN ZINC 500 UNIT/GM EX OINT: TOPICAL_OINTMENT | CUTANEOUS | 0 refills | Status: AC

## 2016-01-02 MED ORDER — TETANUS-DIPHTH-ACELL PERTUSSIS 5-2.5-18.5 LF-MCG/0.5 IM SUSP
0.5000 mL | Freq: Once | INTRAMUSCULAR | Status: AC
  Administered 2016-01-02: 0.5 mL via INTRAMUSCULAR
  Filled 2016-01-02: qty 0.5

## 2016-01-02 NOTE — ED Triage Notes (Signed)
Pt arrives to ER via POV with daughter in law after fall this AM at home. Witnessed fall, pt fell while trying to get newspaper. No LOC per witness of fall. Pt has open laceration above left eyebrow, bruising under left eye, abrasion to left shoulder, bruise to left elbow, skin tear to anterior hand.

## 2016-01-02 NOTE — ED Notes (Addendum)
Pt full ROM to left elbow and shoulder without pain, no deformities. Laceration's and abrasions covered with saline gauze.  Pt does not take blood thinners.

## 2016-01-02 NOTE — ED Provider Notes (Signed)
[facility] Emergency Department Provider Note        Time seen: ----------------------------------------- 3:00 PM on 01/02/2016 -----------------------------------------    I have reviewed the triage vital signs and the nursing notes.   HISTORY  Chief Complaint Fall and Head Injury    HPI Beth Randall is a 82 y.o. female who presents to the ER being brought by privatevehicle after a fall this morning at home. She had a witnessed fall, she fell while she was trying to get the newspaper. She denied loss of consciousness but sustained abrasions and a left-sided head injury. She also sustained abrasion to left shoulder, bruising to the elbow and a skin tear to left hand. Patient denies complaints of pain currently, she denies recent illness. Family reports an extensive history of balance disturbance.   Past Medical History:  Diagnosis Date  . Breast cancer ([place])   . Dementia     There are no active problems to display for this patient.   History reviewed. No pertinent surgical history.  Allergies Aspirin and Penicillins  Social History Social History  Substance Use Topics  . Smoking status: Never Smoker  . Smokeless tobacco: Never Used  . Alcohol use No    Review of Systems Constitutional: Negative for fever. Eyes: Negative for visual changes. ENT: Negative for sore throat. Cardiovascular: Negative for chest pain. Respiratory: Negative for shortness of breath. Gastrointestinal: Negative for abdominal pain, vomiting and diarrhea. Genitourinary: Negative for dysuria. Musculoskeletal: Negative for back pain. Skin: Positive for facial and body abrasions, contusions Neurological: Negative for headaches, focal weakness or numbness.  10-point ROS otherwise negative.  ____________________________________________   PHYSICAL EXAM:  VITAL SIGNS: ED Triage Vitals [01/02/16 1152]  Enc Vitals Group     BP 121/77     Pulse Rate 88   Resp 18     Temp 98.3 F (36.8 C)     Temp Source Oral     SpO2 96 %     Weight 124 lb (56.2 kg)     Height      Head Circumference      Peak Flow      Pain Score      Pain Loc      Pain Edu?      Excl. in [place]?     Constitutional: Alert and oriented. Well appearing and in no distress. Eyes: Conjunctivae are normal. PERRL. Normal extraocular movements. ENT   Head: Normocephalic, Left-sided facial abrasions and a 2 cm laceration through the left eyebrow   Nose: No congestion/rhinnorhea.   Mouth/Throat: Mucous membranes are moist.   Neck: No stridor. Cardiovascular: Normal rate, regular rhythm. No murmurs, rubs, or gallops. Respiratory: Normal respiratory effort without tachypnea nor retractions. Breath sounds are clear and equal bilaterally. No wheezes/rales/rhonchi. Gastrointestinal: Soft and nontender. Normal bowel sounds Musculoskeletal: Nontender with normal range of motion in all extremities. Mild tenderness over the left hip Neurologic:  Normal speech and language. No gross focal neurologic deficits are appreciated.  Skin:  Left-sided facial abrasions, skin tears to the left hand dorsally Psychiatric: Mood and affect are normal. Speech and behavior are normal.  ____________________________________________  ED COURSE:  Pertinent labs & imaging results that were available during my care of the patient were reviewed by me and considered in my medical decision making (see chart for details). Clinical Course  Patient with a fall, history of balance disturbance. She is in no acute distress this time, we will obtain basic imaging and she will require wound  repair.  Marland Kitchen.Laceration Repair Date/Time: 01/02/2016 3:07 PM Performed by: [name] Authorized by: Lenise Arena E   Consent:    Consent obtained:  Verbal   Consent given by:  Patient Anesthesia (see MAR for exact dosages):    Anesthesia method:  None Laceration details:    Location:  Scalp    Scalp location:  Frontal   Length (cm):  2   Depth (mm):  0.5 Repair type:    Repair type:  Simple Exploration:    Contaminated: no   Skin repair:    Repair method:  Tissue adhesive Post-procedure details:    Dressing:  Open (no dressing)   Patient tolerance of procedure:  Tolerated well, no immediate complications   ____________________________________________   RADIOLOGY CT head, left hip x-rays  IMPRESSION: 1. Atrophy and small vessel disease. 2.  No evidence for acute intracranial abnormality. 3. Left frontal scalp laceration and edema. 4. Cerumen in bilateral external auditory canals, likely impacted on the right side. IMPRESSION: No acute abnormality.  Stable compared to prior exam.    ____________________________________________  FINAL ASSESSMENT AND PLAN  Fall, minor head injury, scalp laceration, hip contusion, abrasions  Plan: Patient with imaging as dictated above. Patient is in no acute distress, CT and x-ray are unremarkable. She has had good wound closure. We will provide TDAP as well. She is stable for outpatient follow-up with her doctor.   [name], MD   Note: This dictation was prepared with Dragon dictation. Any transcriptional errors that result from this process are unintentional    [name], MD 01/02/16 [phone]

## 2016-06-28 DIAGNOSIS — G479 Sleep disorder, unspecified: Secondary | ICD-10-CM | POA: Insufficient documentation

## 2016-11-22 ENCOUNTER — Emergency Department
Admission: EM | Admit: 2016-11-22 | Discharge: 2016-11-22 | Disposition: A | Discharge status: Home / Self Care | Payer: Medicare Other | Attending: Emergency Medicine | Admitting: Emergency Medicine

## 2016-11-22 ENCOUNTER — Emergency Department: Payer: Medicare Other

## 2016-11-22 ENCOUNTER — Encounter: Payer: Self-pay | Admitting: *Deleted

## 2016-11-22 DIAGNOSIS — Z853 Personal history of malignant neoplasm of breast: Secondary | ICD-10-CM | POA: Insufficient documentation

## 2016-11-22 DIAGNOSIS — Y92092 Bedroom in other non-institutional residence as the place of occurrence of the external cause: Secondary | ICD-10-CM | POA: Insufficient documentation

## 2016-11-22 DIAGNOSIS — W0110XA Fall on same level from slipping, tripping and stumbling with subsequent striking against unspecified object, initial encounter: Secondary | ICD-10-CM | POA: Insufficient documentation

## 2016-11-22 DIAGNOSIS — Y998 Other external cause status: Secondary | ICD-10-CM | POA: Insufficient documentation

## 2016-11-22 DIAGNOSIS — S0003XA Contusion of scalp, initial encounter: Secondary | ICD-10-CM | POA: Diagnosis not present

## 2016-11-22 DIAGNOSIS — Z9012 Acquired absence of left breast and nipple: Secondary | ICD-10-CM | POA: Diagnosis not present

## 2016-11-22 DIAGNOSIS — Y9389 Activity, other specified: Secondary | ICD-10-CM | POA: Diagnosis not present

## 2016-11-22 DIAGNOSIS — S0990XA Unspecified injury of head, initial encounter: Secondary | ICD-10-CM | POA: Diagnosis present

## 2016-11-22 NOTE — ED Notes (Signed)
Pt had witnessed fall this morning. Abrasion and hematoma to posterior head. No LOC. Swelling to bilateral ankles.

## 2016-11-22 NOTE — Discharge Instructions (Signed)
No serious injury is suspected, there is a bruise to the back of the scalp, but CT scan was reassuring for no additional serious traumatic injury.  Return to the emergency room immediately for any worsening condition including confusion altered mental status, weakness, numbness, or any other symptoms concerning to you.  As we discussed, if the leg swelling continues, please follow-up with your primary doctor.

## 2016-11-22 NOTE — ED Notes (Signed)
ED Provider at bedside. 

## 2016-11-22 NOTE — ED Notes (Signed)
Pt alert and oriented X4, active, cooperative, pt in NAD. RR even and unlabored, color WNL.  Pt informed to return if any life threatening symptoms occur.   
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
## 2016-11-22 NOTE — ED Provider Notes (Signed)
[facility] Emergency Department Provider Note ____________________________________________   I have reviewed the triage vital signs and the triage nursing note.  HISTORY  Chief Complaint Fall   Historian Patient is a fairly poor historian, her daughter-in-law lives with her provides most of the history.  HPI Beth Randall is a 83 y.o. female with a history of dementia, thin and elderly had unwitnessed fall and dark bedroom overnight around 3:30. No reported loss of consciousness, daughter-in-law states that she went to see her immediately and there didn't seem to be really any complaint however this morning they noted that there is a bruise or not to the back of the scalp. Patient is not reporting any neck discomfort and chest discomfort, abdominal discomfort, or extremity pain. No reported change in mental status.  Symptoms mild.  Daughter states that she noticed mild lower extremity edema especially in the right ankle without any pain, and thinks this is probably new since she bathes her she would've noticed it before.    Past Medical History:  Diagnosis Date  . Breast cancer ([place])   . Dementia     There are no active problems to display for this patient.   Past Surgical History:  Procedure Laterality Date  . BACK SURGERY    . MASTECTOMY Left     Prior to Admission medications   Medication Sig Start Date End Date Taking? Authorizing Provider  bacitracin ointment Apply to affected area daily 01/02/16 01/01/17  [name], MD    Allergies  Allergen Reactions  . Aspirin   . Penicillins     No family history on file.  Social History Social History  Substance Use Topics  . Smoking status: Never Smoker  . Smokeless tobacco: Never Used  . Alcohol use No    Review of Systems  Constitutional: Negative for Recent illnesses. Eyes: Negative for visual changes. ENT: Negative for sore throat. Cardiovascular: Negative for chest  pain. Respiratory: Negative for shortness of breath. Gastrointestinal: Negative for abdominal pain, vomiting and diarrhea. Genitourinary: Negative for dysuria. Musculoskeletal: Negative for back pain. Skin: Negative for rash. Neurological: Negative for headache.  ____________________________________________   PHYSICAL EXAM:  VITAL SIGNS: ED Triage Vitals  Enc Vitals Group     BP 11/22/16 1113 120/73     Pulse Rate 11/22/16 1113 81     Resp 11/22/16 1113 18     Temp 11/22/16 1113 98 F (36.7 C)     Temp Source 11/22/16 1113 Oral     SpO2 11/22/16 1113 98 %     Weight 11/22/16 1115 115 lb (52.2 kg)     Height --      Head Circumference --      Peak Flow --      Pain Score 11/22/16 1420 3     Pain Loc --      Pain Edu? --      Excl. in [place]? --      Constitutional: Alert and Cooperative, but poor historian. Well appearing and in no distress. HEENT   Head: Small hematoma posterior scalp.      Eyes: Conjunctivae are normal. Pupils equal and round.       Ears:         Nose: No congestion/rhinnorhea.   Mouth/Throat: Mucous membranes are moist.   Neck: No stridor.  No midline C-spine tenderness to palpation or range of motion. Cardiovascular/Chest: Normal rate, regular rhythm.  No murmurs, rubs, or gallops. Respiratory: Normal respiratory effort without tachypnea  nor retractions. Breath sounds are clear and equal bilaterally. No wheezes/rales/rhonchi. Gastrointestinal: Soft. No distention, no guarding, no rebound. Nontender.   Genitourinary/rectal:Deferred Musculoskeletal: Nontender with normal range of motion in all extremities. No joint effusions.  No lower extremity tenderness.  1+ right lower extremity edema, left trace edema left lower extremity. No Tenderness. No bony tenderness of the 4 extremities. Neurologic:  Normal speech and language. No gross or focal neurologic deficits are appreciated. Skin:  Skin is warm, dry and intact. No rash noted. Psychiatric: Mood  and affect are normal. Speech and behavior are normal. Patient exhibits appropriate insight and judgment.   ____________________________________________  LABS (pertinent positives/negatives)  Labs Reviewed - No data to display  ____________________________________________    EKG I, Lisa Roca, MD, the attending physician have personally viewed and interpreted all ECGs.  None ____________________________________________  RADIOLOGY All Xrays were viewed by me. Imaging interpreted by Radiologist.  CT without contrast:  IMPRESSION: Progressive brain atrophy and chronic white matter microvascular ischemic changes.  No acute intracranial abnormality by noncontrast imaging. __________________________________________  PROCEDURES  Procedure(s) performed: None  Critical Care performed: None  ____________________________________________   ED COURSE / ASSESSMENT AND PLAN  Pertinent labs & imaging results that were available during my care of the patient were reviewed by me and considered in my medical decision making (see chart for details).    Beth Randall was brought in by her daughter-in-law after a fall overnight, and found to have a scalp contusion this morning. She is alert and oriented cooperative.  The C-spine tenderness. Head CT is reassuring for no acute tract findings.  Respiratory evaluation does not appear to have any focal chronic findings. In terms of the concern about trace and 1+ lower extremity edema, I think she has any signs of ongoing CHF, or trunk injury. I am and have her follow-up with a primary care doctor for this.    CONSULTATIONS:   None   Patient / Family / Caregiver informed of clinical course, medical decision-making process, and agree with plan.   I discussed return precautions, follow-up instructions, and discharge instructions with patient and/or family.  Discharge Instructions : No serious injury is suspected, there is a bruise to the  back of the scalp, but CT scan was reassuring for no additional serious traumatic injury.  Return to the emergency room immediately for any worsening condition including confusion altered mental status, weakness, numbness, or any other symptoms concerning to you.  As we discussed, if the leg swelling continues, please follow-up with your primary doctor.  ___________________________________________   FINAL CLINICAL IMPRESSION(S) / ED DIAGNOSES   Final diagnoses:  Contusion of scalp, initial encounter              Note: This dictation was prepared with Dragon dictation. Any transcriptional errors that result from this process are unintentional    Lisa Roca, MD 11/22/16 [phone]

## 2016-11-28 ENCOUNTER — Ambulatory Visit
Admission: RE | Admit: 2016-11-28 | Discharge: 2016-11-28 | Disposition: A | Discharge status: Home / Self Care | Payer: Medicare Other | Source: Ambulatory Visit | Attending: Physician Assistant | Admitting: Physician Assistant

## 2016-11-28 ENCOUNTER — Encounter: Payer: Self-pay | Admitting: *Deleted

## 2016-11-28 ENCOUNTER — Other Ambulatory Visit: Payer: Self-pay | Admitting: Physician Assistant

## 2016-11-28 ENCOUNTER — Emergency Department
Admission: EM | Admit: 2016-11-28 | Discharge: 2016-11-29 | Disposition: A | Discharge status: Home / Self Care | Payer: Medicare Other | Attending: Emergency Medicine | Admitting: Emergency Medicine

## 2016-11-28 DIAGNOSIS — S065X0D Traumatic subdural hemorrhage without loss of consciousness, subsequent encounter: Secondary | ICD-10-CM | POA: Insufficient documentation

## 2016-11-28 DIAGNOSIS — S098XXD Other specified injuries of head, subsequent encounter: Secondary | ICD-10-CM | POA: Diagnosis present

## 2016-11-28 DIAGNOSIS — M5030 Other cervical disc degeneration, unspecified cervical region: Secondary | ICD-10-CM | POA: Diagnosis not present

## 2016-11-28 DIAGNOSIS — F09 Unspecified mental disorder due to known physiological condition: Secondary | ICD-10-CM

## 2016-11-28 DIAGNOSIS — X58XXXA Exposure to other specified factors, initial encounter: Secondary | ICD-10-CM | POA: Insufficient documentation

## 2016-11-28 DIAGNOSIS — Z79899 Other long term (current) drug therapy: Secondary | ICD-10-CM | POA: Diagnosis not present

## 2016-11-28 DIAGNOSIS — F0390 Unspecified dementia without behavioral disturbance: Secondary | ICD-10-CM | POA: Insufficient documentation

## 2016-11-28 DIAGNOSIS — Z853 Personal history of malignant neoplasm of breast: Secondary | ICD-10-CM | POA: Diagnosis not present

## 2016-11-28 DIAGNOSIS — W0110XD Fall on same level from slipping, tripping and stumbling with subsequent striking against unspecified object, subsequent encounter: Secondary | ICD-10-CM | POA: Insufficient documentation

## 2016-11-28 DIAGNOSIS — S065X9A Traumatic subdural hemorrhage with loss of consciousness of unspecified duration, initial encounter: Secondary | ICD-10-CM | POA: Insufficient documentation

## 2016-11-28 DIAGNOSIS — S065XAA Traumatic subdural hemorrhage with loss of consciousness status unknown, initial encounter: Secondary | ICD-10-CM

## 2016-11-28 DIAGNOSIS — S0990XS Unspecified injury of head, sequela: Secondary | ICD-10-CM | POA: Diagnosis present

## 2016-11-28 NOTE — ED Provider Notes (Signed)
[facility] Emergency Department Provider Note   ____________________________________________    I have reviewed the triage vital signs and the nursing notes.   HISTORY  Chief Complaint Head Injury     HPI Beth Randall is a 83 y.o. female who presents from CT scan with reported bilateral subdural hematomas. This was ordered by PCP. Patient had a fall 1 week ago and this was a follow-up scan. Daughter-in-law reports the patient has been acting normally, no neuro deficits reported, able to ambulate as per usual with significant assistance. Patient has no complaints but has a history of dementia.   Past Medical History:  Diagnosis Date  . Breast cancer ([place])   . Dementia     There are no active problems to display for this patient.   Past Surgical History:  Procedure Laterality Date  . BACK SURGERY    . MASTECTOMY Left     Prior to Admission medications   Medication Sig Start Date End Date Taking? Authorizing Provider  NAMZARIC 28-10 MG CP24 Take 1 capsule by mouth daily. 11/17/16  Yes [provider]  sertraline (ZOLOFT) 50 MG tablet Take 75 mg by mouth daily. 11/18/16  Yes [provider]  traZODone (DESYREL) 50 MG tablet Take 50-100 mg by mouth as directed. 11/17/16  Yes [provider]  bacitracin ointment Apply to affected area daily Patient not taking: Reported on 11/28/2016 01/02/16 01/01/17  Beth Newport, MD     Allergies Aspirin and Penicillins  History reviewed. No pertinent family history.  Social History Social History  Substance Use Topics  . Smoking status: Never Smoker  . Smokeless tobacco: Never Used  . Alcohol use No    Review of Systems  Constitutional: No fever/chills Eyes: No visual changes.  ENT: No Neck pain Cardiovascular: Denies chest pain. Respiratory: Denies shortness of breath. Gastrointestinal: No abdominal pain.  No nausea, no vomiting.   Genitourinary: Negative for  dysuria. Musculoskeletal: Negative for back pain. Skin: Negative for rash. Neurological: Negative for headaches or Focal weakness   ____________________________________________   PHYSICAL EXAM:  VITAL SIGNS: ED Triage Vitals  Enc Vitals Group     BP 11/28/16 1325 135/74     Pulse Rate 11/28/16 1325 76     Resp 11/28/16 1325 18     Temp 11/28/16 1325 97.6 F (36.4 C)     Temp Source 11/28/16 1325 Oral     SpO2 11/28/16 1325 98 %     Weight 11/28/16 1326 52.2 kg (115 lb)     Height 11/28/16 1326 1.651 m (5\' 5" )     Head Circumference --      Peak Flow --      Pain Score 11/28/16 1325 0     Pain Loc --      Pain Edu? --      Excl. in [place]? --     Constitutional: Alert and oriented. No acute distress. Pleasant and interactive Eyes: Conjunctivae are normal. PERRLA, EOMI Head: Atraumatic. Nose: No congestion/rhinnorhea. Mouth/Throat: Mucous membranes are moist.    Cardiovascular: Normal rate, regular rhythm. Grossly normal heart sounds.  Good peripheral circulation. Respiratory: Normal respiratory effort.  No retractions. Lungs CTAB. Gastrointestinal: Soft and nontender. No distention.  No CVA tenderness. Genitourinary: deferred Musculoskeletal: No lower extremity tenderness nor edema.  Warm and well perfused Neurologic:  Normal speech and language. No gross focal neurologic deficits are appreciated. Normal strength in all extremities, cranial nerves II 12 normal Skin:  Skin is  warm, dry and intact. No rash noted. Psychiatric: Mood and affect are normal. Speech and behavior are normal.  ____________________________________________   LABS (all labs ordered are listed, but only abnormal results are displayed)  Labs Reviewed - No data to display ____________________________________________  EKG  None ____________________________________________  RADIOLOGY  Bilateral subdural hematoma ____________________________________________   PROCEDURES  Procedure(s)  performed: No    Critical Care performed: no ____________________________________________   INITIAL IMPRESSION / ASSESSMENT AND PLAN / ED COURSE  Pertinent labs & imaging results that were available during my care of the patient were reviewed by me and considered in my medical decision making (see chart for details).  Patient sent from CT with bilateral subdural hematomas. Patient is quite well-appearing and in no distress. Neurologically intact. Daughter-in-law who is primary caregiver reports the patient has been doing well. Discussed with Dr. Cari Caraway of neurosurgery who studied the CT scan and recommends discharge as he feels this is subacute/chronic. I discussed this with caregiver and she feels comfortable with this. They will follow up with neurosurgery next week for a rescan. Another return if any change in her symptoms    ____________________________________________   FINAL CLINICAL IMPRESSION(S) / ED DIAGNOSES  Final diagnoses:  Subdural hematoma ([facility])      NEW MEDICATIONS STARTED DURING THIS VISIT:  New Prescriptions   No medications on file     Note:  This document was prepared using Dragon voice recognition software and may include unintentional dictation errors.    Lavonia Drafts, MD 11/28/16 1426

## 2016-11-28 NOTE — ED Triage Notes (Signed)
Pt sent from PCP after a CT scan showing worsening hematomas, pt fell Wednesday and was seen in ED, pt denies any pain but is not talking much in triage, appears comfortable, family with pt

## 2016-11-28 NOTE — ED Triage Notes (Signed)
Pt here after CT results say worsening bilateral subdural hematomas.

## 2016-11-29 NOTE — ED Notes (Signed)
Pain assessment documented for removal of pt from board. Pt no longer in ED.

## 2017-01-29 ENCOUNTER — Other Ambulatory Visit
Admission: RE | Admit: 2017-01-29 | Discharge: 2017-01-29 | Disposition: A | Discharge status: Home / Self Care | Payer: Medicare Other | Source: Ambulatory Visit | Attending: Infectious Diseases | Admitting: Infectious Diseases

## 2017-01-29 DIAGNOSIS — N390 Urinary tract infection, site not specified: Secondary | ICD-10-CM | POA: Insufficient documentation

## 2017-01-29 LAB — URINALYSIS, COMPLETE (UACMP) WITH MICROSCOPIC
Bacteria, UA: NONE SEEN
Bilirubin Urine: NEGATIVE
GLUCOSE, UA: NEGATIVE mg/dL
Hgb urine dipstick: NEGATIVE
KETONES UR: NEGATIVE mg/dL
LEUKOCYTES UA: NEGATIVE
Nitrite: NEGATIVE
Protein, ur: NEGATIVE mg/dL
Specific Gravity, Urine: 1.009 (ref 1.005–1.030)
pH: 7 (ref 5.0–8.0)

## 2017-01-30 LAB — URINE CULTURE: Culture: <10000 — AB

## 2017-03-11 ENCOUNTER — Emergency Department
Admission: EM | Admit: 2017-03-11 | Discharge: 2017-03-11 | Disposition: A | Discharge status: Home / Self Care | Payer: Medicare Other | Attending: Emergency Medicine | Admitting: Emergency Medicine

## 2017-03-11 ENCOUNTER — Encounter: Payer: Self-pay | Admitting: Emergency Medicine

## 2017-03-11 DIAGNOSIS — F0390 Unspecified dementia without behavioral disturbance: Secondary | ICD-10-CM | POA: Diagnosis not present

## 2017-03-11 DIAGNOSIS — R55 Syncope and collapse: Secondary | ICD-10-CM | POA: Diagnosis present

## 2017-03-11 DIAGNOSIS — Z79899 Other long term (current) drug therapy: Secondary | ICD-10-CM | POA: Diagnosis not present

## 2017-03-11 LAB — URINALYSIS, COMPLETE (UACMP) WITH MICROSCOPIC
BACTERIA UA: NONE SEEN
BILIRUBIN URINE: NEGATIVE
Glucose, UA: NEGATIVE mg/dL
Hgb urine dipstick: NEGATIVE
KETONES UR: NEGATIVE mg/dL
LEUKOCYTES UA: NEGATIVE
Nitrite: NEGATIVE
PH: 6 (ref 5.0–8.0)
Protein, ur: NEGATIVE mg/dL
Specific Gravity, Urine: 1.012 (ref 1.005–1.030)

## 2017-03-11 LAB — CBC
HCT: 35.9 % (ref 35.0–47.0)
HEMOGLOBIN: 12.1 g/dL (ref 12.0–16.0)
MCH: 30.2 pg (ref 26.0–34.0)
MCHC: 33.8 g/dL (ref 32.0–36.0)
MCV: 89.2 fL (ref 80.0–100.0)
Platelets: 200 10*3/uL (ref 150–440)
RBC: 4.03 MIL/uL (ref 3.80–5.20)
RDW: 13.8 % (ref 11.5–14.5)
WBC: 8.1 10*3/uL (ref 3.6–11.0)

## 2017-03-11 LAB — BASIC METABOLIC PANEL
ANION GAP: 8 (ref 5–15)
BUN: 14 mg/dL (ref 6–20)
CHLORIDE: 101 mmol/L (ref 101–111)
CO2: 27 mmol/L (ref 22–32)
Calcium: 9.3 mg/dL (ref 8.9–10.3)
Creatinine, Ser: 0.75 mg/dL (ref 0.44–1.00)
GFR calc non Af Amer: >60 mL/min (ref >60)
Glucose, Bld: 110 mg/dL — ABNORMAL HIGH (ref 65–99)
POTASSIUM: 4.7 mmol/L (ref 3.5–5.1)
Sodium: 136 mmol/L (ref 135–145)

## 2017-03-11 LAB — TROPONIN I
Troponin I: <0.03 ng/mL (ref <0.03)
Troponin I: <0.03 ng/mL (ref <0.03)

## 2017-03-11 NOTE — ED Triage Notes (Signed)
Pt brought in by daughter in law which states directly after giving her a shower she passed out in her arms.

## 2017-03-11 NOTE — ED Notes (Signed)
Pt with family, syncope, family performed mouth to mouth, unresponsive for a "few minutes"

## 2017-03-11 NOTE — Discharge Instructions (Signed)
Please seek medical attention for any high fevers, chest pain, shortness of breath, change in behavior, persistent vomiting, bloody stool or any other new or concerning symptoms.  

## 2017-03-11 NOTE — ED Provider Notes (Signed)
[facility] Emergency Department Provider Note   ____________________________________________   I have reviewed the triage vital signs and the nursing notes.   HISTORY  Chief Complaint Loss of Consciousness   History limited and level 5 caveat due to dementia. History primarily obtained from caregiver and daughter in law.    HPI Beth MAHLER is a 83 y.o. female who presents to the emergency department today after a syncopal episode.   DURATION:~ 3 minutes TIMING: one episode SEVERITY: patient was unresponsive CONTEXT: caretaker had just given the patient a warm shower, had sat her down to dry her off when she passed out ASSOCIATED SYMPTOMS: no recent fevers, no chest pain, no shortness of breath  Per medical record patient has a history of dementia, breast cancer.  Past Medical History:  Diagnosis Date  . Breast cancer ([employer])   . Dementia     There are no active problems to display for this patient.   Past Surgical History:  Procedure Laterality Date  . BACK SURGERY    . MASTECTOMY Left     Prior to Admission medications   Medication Sig Start Date End Date Taking? Authorizing Provider  carbamide peroxide (DEBROX) 6.5 % OTIC solution 5 drops 2 (two) times daily.   Yes [provider]  NAMZARIC 28-10 MG CP24 Take 1 capsule by mouth daily. 11/17/16  Yes [provider]  sertraline (ZOLOFT) 50 MG tablet Take 75 mg by mouth daily. 11/18/16  Yes [provider]  traZODone (DESYREL) 50 MG tablet Take 50-100 mg by mouth as directed. 11/17/16  Yes [provider]    Allergies Aspirin and Penicillins  No family history on file.  Social History Social History  Substance Use Topics  . Smoking status: Never Smoker  . Smokeless tobacco: Never Used  . Alcohol use No    Review of Systems Constitutional: No fever/chills Eyes: No visual changes. ENT: No sore throat. Cardiovascular: Denies chest  pain. Respiratory: Denies shortness of breath. Gastrointestinal: No abdominal pain.  No nausea, no vomiting.  No diarrhea.   Genitourinary: Negative for dysuria. Musculoskeletal: Negative for back pain. Skin: Negative for rash. Neurological: Negative for headaches, focal weakness or numbness.  ____________________________________________   PHYSICAL EXAM:  VITAL SIGNS: ED Triage Vitals  Enc Vitals Group     BP 03/11/17 1420 103/63     Pulse Rate 03/11/17 1420 79     Resp 03/11/17 1420 16     Temp 03/11/17 1420 97.6 F (36.4 C)     Temp Source 03/11/17 1420 Oral     SpO2 03/11/17 1420 97 %     Weight 03/11/17 1422 115 lb (52.2 kg)   Constitutional: Awake and alert. Not oriented to events. Pleasant. Eyes: Conjunctivae are normal.  ENT   Head: Normocephalic and atraumatic.   Nose: No congestion/rhinnorhea.   Mouth/Throat: Mucous membranes are moist.   Neck: No stridor.  Hematological/Lymphatic/Immunilogical: No cervical lymphadenopathy. Cardiovascular: Normal rate, regular rhythm.  No murmurs, rubs, or gallops.  Respiratory: Normal respiratory effort without tachypnea nor retractions. Breath sounds are clear and equal bilaterally. No wheezes/rales/rhonchi. Gastrointestinal: Soft and non tender. No rebound. No guarding.  Genitourinary: Deferred Musculoskeletal: Normal range of motion in all extremities. No lower extremity edema. Neurologic:  Normal speech and language. No gross focal neurologic deficits are appreciated.  Skin:  Skin is warm, dry and intact. No rash noted. Psychiatric: Pleasantly demented. ____________________________________________    LABS (pertinent positives/negatives)  Trop < 0.03 x 2 UA without signs  of infection CBC wnl BMP wnl save for gluocose 110  ____________________________________________   EKG  I, Nance Pear, attending physician, personally viewed and interpreted this EKG  EKG Time: 1426 Rate: 77 Rhythm: normal  sinus rhythm  Axis: left axis deviation Intervals: qtc 457 QRS: narrow, q waves II III avf ST changes: no st elevation Impression: abnormal ekg   ____________________________________________    RADIOLOGY  None   ____________________________________________   PROCEDURES  Procedures  ____________________________________________   INITIAL IMPRESSION / ASSESSMENT AND PLAN / ED COURSE  Pertinent labs & imaging results that were available during my care of the patient were reviewed by me and considered in my medical decision making (see chart for details).  patient presented to the emergency department today after a syncopal episode. ddx includes cardiac ischemia, vasovagal, seizure, PE, anemia amongst other etiology.  ----------------------------------------- 5:10 PM on 03/11/2017 -----------------------------------------   OBSERVATION CARE: This patient is being placed under observation care for the following reasons: Syncopal episode, repeat troponin to rule out cardiac ischemia  ----------------------------------------- 5:30 PM on 03/11/2017 -----------------------------------------  Patient continues to deny any pain.  ----------------------------------------- 6:21 PM on 03/11/2017 -----------------------------------------   END OF OBSERVATION STATUS: After an appropriate period of observation, this patient is being discharged due to the following reason(s):  2 sets of negative troponin. No other concerning findings on blood work or urine.    Discussed with patient/family results of testing/physical exam, differential plan and return precautions.  ____________________________________________   FINAL CLINICAL IMPRESSION(S) / ED DIAGNOSES  Final diagnoses:  Syncope, unspecified syncope type  Dementia without behavioral disturbance, unspecified dementia type     Note: This dictation was prepared with Dragon dictation. Any transcriptional errors that  result from this process are unintentional     Nance Pear, MD 03/11/17 Vernelle Emerald

## 2017-03-29 DIAGNOSIS — Z8659 Personal history of other mental and behavioral disorders: Secondary | ICD-10-CM | POA: Insufficient documentation

## 2017-04-15 DIAGNOSIS — M25562 Pain in left knee: Secondary | ICD-10-CM | POA: Insufficient documentation

## 2017-05-03 DIAGNOSIS — B351 Tinea unguium: Secondary | ICD-10-CM | POA: Insufficient documentation

## 2017-06-28 ENCOUNTER — Emergency Department: Payer: Medicare Other

## 2017-06-28 ENCOUNTER — Other Ambulatory Visit: Payer: Self-pay

## 2017-06-28 ENCOUNTER — Emergency Department
Admission: EM | Admit: 2017-06-28 | Discharge: 2017-06-28 | Disposition: A | Discharge status: Home / Self Care | Payer: Medicare Other | Attending: Emergency Medicine | Admitting: Emergency Medicine

## 2017-06-28 ENCOUNTER — Encounter: Payer: Self-pay | Admitting: Emergency Medicine

## 2017-06-28 DIAGNOSIS — W07XXXA Fall from chair, initial encounter: Secondary | ICD-10-CM | POA: Insufficient documentation

## 2017-06-28 DIAGNOSIS — Z853 Personal history of malignant neoplasm of breast: Secondary | ICD-10-CM | POA: Diagnosis not present

## 2017-06-28 DIAGNOSIS — Y939 Activity, unspecified: Secondary | ICD-10-CM | POA: Diagnosis not present

## 2017-06-28 DIAGNOSIS — F0390 Unspecified dementia without behavioral disturbance: Secondary | ICD-10-CM | POA: Insufficient documentation

## 2017-06-28 DIAGNOSIS — Z79899 Other long term (current) drug therapy: Secondary | ICD-10-CM | POA: Insufficient documentation

## 2017-06-28 DIAGNOSIS — S065X9A Traumatic subdural hemorrhage with loss of consciousness of unspecified duration, initial encounter: Secondary | ICD-10-CM | POA: Diagnosis not present

## 2017-06-28 DIAGNOSIS — Y929 Unspecified place or not applicable: Secondary | ICD-10-CM | POA: Diagnosis not present

## 2017-06-28 DIAGNOSIS — S299XXA Unspecified injury of thorax, initial encounter: Secondary | ICD-10-CM | POA: Diagnosis present

## 2017-06-28 DIAGNOSIS — Y999 Unspecified external cause status: Secondary | ICD-10-CM | POA: Insufficient documentation

## 2017-06-28 DIAGNOSIS — S2241XA Multiple fractures of ribs, right side, initial encounter for closed fracture: Secondary | ICD-10-CM | POA: Insufficient documentation

## 2017-06-28 DIAGNOSIS — S065XAA Traumatic subdural hemorrhage with loss of consciousness status unknown, initial encounter: Secondary | ICD-10-CM

## 2017-06-28 MED ORDER — ACETAMINOPHEN 325 MG PO TABS
650.0000 mg | ORAL_TABLET | Freq: Once | ORAL | Status: AC
  Administered 2017-06-28: 650 mg via ORAL
  Filled 2017-06-28: qty 2

## 2017-06-28 NOTE — ED Triage Notes (Addendum)
Pt arrived via POV with daughter in law, pt fell while she was getting up from the chair by herself.  Pt c/o middle back pain since the fall. When daughter found patient she was lying on her left side on the floor. Pt was not on the floor long. Daughter in law states she was in the bathroom when she fell.  Also has skin tear to left arm.  Daughter in law states the patient denied hitting her head and has not complained of any pain to her head.  Daughter in law states that the patient is not supposed to be getting up without help.  States the patient is not supposed to walk without having help next to her.    Pt does have hx of dementia.

## 2017-06-28 NOTE — ED Notes (Signed)
EDP to bedside to provide pt and family with update. 

## 2017-06-28 NOTE — ED Notes (Signed)
Pt and caregiver provided teaching on incentive spirometer; patient with limited understanding due to advanced dementia.

## 2017-06-28 NOTE — ED Notes (Signed)
Patient transported to CT 

## 2017-06-28 NOTE — ED Provider Notes (Addendum)
[facility] Emergency Department Provider Note  ____________________________________________   First MD Initiated Contact with Patient 06/28/17 1500     (approximate)  I have reviewed the triage vital signs and the nursing notes.   HISTORY  Chief Complaint Fall   HPI Beth Randall is a 84 y.o. female with a history of breast cancer status post right-sided mastectomy as well as dementia was presented to the emergency department after fall yesterday.  She is coming by her daughter-in-law who says that the patient was unaccompanied for just a minute in a recliner when she got up out of the chair and then fell, falling onto her right side.  She sustained a skin tear to her right arm laterally and appeared to fold her back.  It is unknown whether the patient hit her head.  No obvious or prolonged loss of consciousness.  Daughter-in-law says the patient had her last tetanus shot within the last 1-2 years.  Patient took Tylenol last night for pain but otherwise has not had any other pain medications.  Daughter-in-law says the patient was complaining of back pain last night but now is claiming of back pain today although is able to walk.  Pain is to the thoracic spine at the midline.  No reports of weakness, changes in bowel or bladder continence.   Past Medical History:  Diagnosis Date  . Breast cancer ([place])   . Dementia     There are no active problems to display for this patient.   Past Surgical History:  Procedure Laterality Date  . BACK SURGERY    . MASTECTOMY Left     Prior to Admission medications   Medication Sig Start Date End Date Taking? Authorizing Provider  carbamide peroxide (DEBROX) 6.5 % OTIC solution 5 drops 2 (two) times daily.    [provider]  NAMZARIC 28-10 MG CP24 Take 1 capsule by mouth daily. 11/17/16   [provider]  sertraline (ZOLOFT) 50 MG tablet Take 75 mg by mouth daily. 11/18/16   [provider]    traZODone (DESYREL) 50 MG tablet Take 50-100 mg by mouth as directed. 11/17/16   [provider]    Allergies Aspirin and Penicillins  History reviewed. No pertinent family history.  Social History Social History   Tobacco Use  . Smoking status: Never Smoker  . Smokeless tobacco: Never Used  Substance Use Topics  . Alcohol use: No  . Drug use: No    Review of Systems  Constitutional: No fever/chills Eyes: No visual changes. ENT: No sore throat. Cardiovascular: Denies chest pain. Respiratory: Denies shortness of breath. Gastrointestinal: No abdominal pain.  No nausea, no vomiting.  No diarrhea.  No constipation. Genitourinary: Negative for dysuria. Musculoskeletal: As above Skin: Negative for rash. Neurological: Negative for headaches, focal weakness or numbness.   ____________________________________________   PHYSICAL EXAM:  VITAL SIGNS: ED Triage Vitals [06/28/17 1416]  Enc Vitals Group     BP 127/76     Pulse Rate 76     Resp 18     Temp 97.6 F (36.4 C)     Temp Source Oral     SpO2 99 %     Weight 105 lb (47.6 kg)     Height 5\' 6"  (1.676 m)     Head Circumference      Peak Flow      Pain Score      Pain Loc      Pain Edu?  Excl. in [place]?     Constitutional: Alert and oriented. Well appearing and in no acute distress. Eyes: Conjunctivae are normal.  Head: Atraumatic. Nose: No congestion/rhinnorhea. Mouth/Throat: Mucous membranes are moist.  Neck: No stridor.  No tenderness palpation of the midline cervical spine.  No deformity or step-off. Cardiovascular: Normal rate, regular rhythm. Grossly normal heart sounds.   Respiratory: Normal respiratory effort.  No retractions. Lungs CTAB. Gastrointestinal: Soft and nontender. No distention. No CVA tenderness. Musculoskeletal: No lower extremity tenderness nor edema.  No joint effusions.  No tenderness to the bilateral hips.  No leg shortening.  Patient has 5 out of 5 strength of bilateral  lower extremities but with mild to moderate pain to the left knee on active range of motion.  However, the daughter-in-law says that the patient has arthritis to the left knee and gets "shots" to the left knee every so often.  There is no effusion the left knee.  No swelling of the left knee or tenderness when palpated.  Mild mid to lower thoracic midline tenderness to palpation without any deformity or step-off.  Very small ecchymotic area about 1 cm to around the T8-T9 region but without step-off.  Also with mild tenderness to palpation to the right lower ribs without point tenderness.  Neurologic:  Normal speech and language. No gross focal neurologic deficits are appreciated. Skin: Curvilinear skin tear which superficial over the right lateral arm without any active bleeding.  The curvilinear segment is about 4-5 cm.  There is a bandage overlying it was placed by the family at home.  No pus, induration.  No deformity to the right arm or tenderness to palpation over the distal humerus.  Patient has full range of motion to the right arm. Psychiatric: Mood and affect are normal. Speech and behavior are normal.  ____________________________________________   LABS (all labs ordered are listed, but only abnormal results are displayed)  Labs Reviewed - No data to display ____________________________________________  EKG   ____________________________________________  RADIOLOGY  Reviewed the images myself.  Rib series with nondisplaced fracture of the right seventh and eighth ribs with a very small effusion.  T-spine negative for fracture.  CT head with thin bilateral subdural hematoma small than the collections on the prior CT.  Hematomas are frontal and favored her older rather than acute blood products. ____________________________________________   PROCEDURES  Procedure(s) performed:   Procedures  Critical Care performed:   ____________________________________________   INITIAL  IMPRESSION / ASSESSMENT AND PLAN / ED COURSE  Pertinent labs & imaging results that were available during my care of the patient were reviewed by me and considered in my medical decision making (see chart for details).  DDX: Skin tear, rib fracture thoracic spine fracture, skull fracture, intra-cranial hemorrhage, contusion, back pain As part of my medical decision making, I reviewed the following data within the [facility] Notes from prior ED visits  ----------------------------------------- 5:23 PM on 06/28/2017 -----------------------------------------  Patient with small effusion with 2 very minimally displaced rib fractures.  No distress at this time.  Taking deep breaths without hindrance or restriction secondary to pain.  Patient also found to have what look like subacute to chronic subdural hematomas which are reduced in size from the bilateral subdural hematomas found in June 2018.  I discussed the case with Dr. Lacinda Axon of neurosurgery at [facility] who suggested similarly that the patient should follow-up in the clinic with Dr. Cari Caraway who the patient has been evaluated by before the last time she  was found to have subdural hematomas.  He did not recommend any intervention at this time.  The patient's daughter-in-law says that the patient is acting at her normal baseline mental status.  No seizure activity noted.  We discussed plan for follow-up with neurosurgery in the office as well as follow-up with Dr. Genevive Bi of cardiothoracic surgery in a week to monitor the effusion in the chest.  We also discussed pain control with Tylenol as well as using an incentive spirometer.  We discussed taking 10 deep breaths twice an hour each hour the patient is awake.  Patient's daughter-in-law is understanding of this plan willing to comply.  Patient will be discharged home at this time.  We discussed possible complications.  The daughter-in-law knows to return for any worsening or concerning issues.   I also discussed the case with Dr. Hampton Abbot of surgery who recommends follow-up with Dr. Genevive Bi for the very small pleural effusion and rib fractures in 1 week.  Patient's daughter-in-law understands all the diagnoses and treatment plans.  We also reviewed the x-ray images together.  Patient will also continue with Tylenol for pain control as the patient's pain appears controlled at this time and I believe that a stronger pain medication may also the patient's consciousness that she is already suffering from dementia and falls.     ____________________________________________   FINAL CLINICAL IMPRESSION(S) / ED DIAGNOSES  Rib fractures.  Subdural hematomas.    NEW MEDICATIONS STARTED DURING THIS VISIT:  New Prescriptions   No medications on file     Note:  This document was prepared using Dragon voice recognition software and may include unintentional dictation errors.     Orbie Pyo, MD 06/28/17 1725    Orbie Pyo, MD 06/28/17 [place], Randall An, MD 06/28/17 [phone]

## 2017-06-28 NOTE — ED Triage Notes (Signed)
First Nurse Note: Arrives with daughter in law.  Patient fell from chair yesterday.  C/O low back pain.

## 2017-07-01 DIAGNOSIS — J90 Pleural effusion, not elsewhere classified: Secondary | ICD-10-CM

## 2017-07-01 NOTE — Telephone Encounter (Signed)
Error

## 2017-07-02 ENCOUNTER — Telehealth: Payer: Self-pay

## 2017-07-02 ENCOUNTER — Other Ambulatory Visit: Payer: Self-pay

## 2017-07-02 DIAGNOSIS — D367 Benign neoplasm of other specified sites: Secondary | ICD-10-CM | POA: Insufficient documentation

## 2017-07-02 DIAGNOSIS — L720 Epidermal cyst: Secondary | ICD-10-CM | POA: Insufficient documentation

## 2017-07-02 DIAGNOSIS — M6740 Ganglion, unspecified site: Secondary | ICD-10-CM | POA: Insufficient documentation

## 2017-07-02 NOTE — Telephone Encounter (Signed)
Spoke with patients son Jacqualine Mau and let him know patient needs chest xray prior to seeing Dr.Oaks.  Patient instructed to go to [facility] radiology at 10:00 and then come to [facility] for appointment with dr.oaks.   Patients son verbalized understanding.

## 2017-07-05 ENCOUNTER — Ambulatory Visit
Admission: RE | Admit: 2017-07-05 | Discharge: 2017-07-05 | Disposition: A | Discharge status: Home / Self Care | Payer: Medicare Other | Source: Ambulatory Visit | Attending: Cardiothoracic Surgery | Admitting: Cardiothoracic Surgery

## 2017-07-05 ENCOUNTER — Encounter: Payer: Self-pay | Admitting: Cardiothoracic Surgery

## 2017-07-05 ENCOUNTER — Ambulatory Visit (INDEPENDENT_AMBULATORY_CARE_PROVIDER_SITE_OTHER): Payer: Medicare Other | Admitting: Cardiothoracic Surgery

## 2017-07-05 VITALS — BP 116/71 | HR 81 | Temp 97.5°F | Resp 18 | Ht 66.0 in

## 2017-07-05 DIAGNOSIS — J449 Chronic obstructive pulmonary disease, unspecified: Secondary | ICD-10-CM | POA: Diagnosis not present

## 2017-07-05 DIAGNOSIS — J90 Pleural effusion, not elsewhere classified: Secondary | ICD-10-CM | POA: Insufficient documentation

## 2017-07-05 DIAGNOSIS — X58XXXA Exposure to other specified factors, initial encounter: Secondary | ICD-10-CM | POA: Insufficient documentation

## 2017-07-05 DIAGNOSIS — S2231XA Fracture of one rib, right side, initial encounter for closed fracture: Secondary | ICD-10-CM | POA: Insufficient documentation

## 2017-07-05 DIAGNOSIS — S2241XA Multiple fractures of ribs, right side, initial encounter for closed fracture: Secondary | ICD-10-CM

## 2017-07-05 NOTE — Patient Instructions (Signed)
Please call our office if you have questions or concerns.   

## 2017-07-05 NOTE — Progress Notes (Signed)
Patient ID: Beth Randall, female   DOB: 1923/12/13, 84 y.o.   MRN: [id number]  No chief complaint on file.   Referred By Dr. Ola Spurr Reason for Referral rib fracture  HPI Location, Quality, Duration, Severity, Timing, Context, Modifying Factors, Associated Signs and Symptoms.  Beth Randall is a 84 y.o. female.  She is accompanied today by her daughter-in-law.  The patient has severe dementia and is unable to give a solid history.  According to the daughter-in-law she was sitting in a chair when she attempted to stand up and fell injuring her right chest.  The patient does complain of some chest pain and was taken to the emergency department where chest x-ray showed some nondisplaced rib fractures.  That was several days ago when she comes in today for further follow-up.  The caregiver also states that she had a skin tear on her right arm.  She has been cleansing this daily and applying some bacitracin ointment and covering it.  She also gives her mother-in-law a bath and her mother-in-law does complain of some discomfort in her right posterior chest wall when she is being bathed.  She has no other complaints.  Her appetite has been good at home.  She is able to walk for short distances with a walker.  She does get in the shower and uses a shower chair.  Her daughter-in-law takes her out every day and that has been going as per routine.   Past Medical History:  Diagnosis Date  . Breast cancer ([place])   . Dementia     Past Surgical History:  Procedure Laterality Date  . BACK SURGERY    . MASTECTOMY Left     No family history on file.  Social History Social History   Tobacco Use  . Smoking status: Never Smoker  . Smokeless tobacco: Never Used  Substance Use Topics  . Alcohol use: No  . Drug use: No    Allergies  Allergen Reactions  . Ibuprofen Rash    Tongue swelling  . Aspirin   . Penicillins     .Has patient had a PCN reaction causing immediate rash,  facial/tongue/throat swelling, SOB or lightheadedness with hypotension: Unknown Has patient had a PCN reaction causing severe rash involving mucus membranes or skin necrosis: Unknown Has patient had a PCN reaction that required hospitalization: Unknown Has patient had a PCN reaction occurring within the last 10 years: Unknown If all of the above answers are "NO", then may proceed with Cephalosporin use.     Current Outpatient Medications  Medication Sig Dispense Refill  . memantine (NAMENDA) 10 MG tablet Take 1 tablet by mouth 1 day or 1 dose.    . sertraline (ZOLOFT) 50 MG tablet Take 75 mg by mouth daily.  5  . traZODone (DESYREL) 50 MG tablet Take 50-100 mg by mouth as directed.  11   No current facility-administered medications for this visit.       Review of Systems A complete review of systems was asked and was negative except for the following positive findings she is unable to give any review of systems but according to the caregiver there is been no acute changes other than the pain in her right lateral chest wall and her arm skin tear.  Blood pressure 116/71, pulse 81, temperature (!) 97.5 F (36.4 C), temperature source Oral, resp. rate 18, height 5\' 6"  (1.676 m), SpO2 98 %.  Physical Exam CONSTITUTIONAL:  Pleasant, well-developed, well-nourished, and in no acute distress.  EYES: Pupils equal and reactive to light, Sclera non-icteric EARS, NOSE, MOUTH AND THROAT:  The oropharynx was clear.  Dentition is good repair.  Oral mucosa pink and moist. LYMPH NODES:  Lymph nodes in the neck and axillae were normal RESPIRATORY:  Lungs were clear.  Normal respiratory effort without pathologic use of accessory muscles of respiration.  There is no bruising.  There is no ecchymosis. CARDIOVASCULAR: Heart was regular without murmurs.  There were no carotid bruits. GI: The abdomen was soft, nontender, and nondistended. There were no palpable masses. There was no hepatosplenomegaly. There  were normal bowel sounds in all quadrants. SKIN:  There were no pathologic skin lesions.  There were no nodules on palpation.  There was a 2 cm skin tear located on the distal right arm.  There was no drainage.  There is no erythema.  Data Reviewed Chest x-rays  I have personally reviewed the patient's imaging, laboratory findings and medical records.    Assessment    Right-sided rib fracture secondary to fall.  She also has sustained a superficial skin laceration of the right arm    Plan    We did get a chest x-ray today which have independently reviewed.  I see no pneumothorax or pleural effusion.  The rib fractures are hard to delineate on today's film.  I did explain to the caregiver that she was doing a good job with her arm dressing and that she should continue that.  I also told her that she could give her mother-in-law 2 Tylenol tablets at nighttime for assistance with sleep if she were complaining of pain.  We did not make a follow-up appointment for her.       Nestor Lewandowsky, MD 07/05/2017, 1:00 PM

## 2017-09-06 ENCOUNTER — Encounter: Payer: Self-pay | Admitting: Emergency Medicine

## 2017-09-06 ENCOUNTER — Emergency Department
Admission: EM | Admit: 2017-09-06 | Discharge: 2017-09-06 | Disposition: A | Discharge status: Home / Self Care | Payer: Medicare Other | Attending: Emergency Medicine | Admitting: Emergency Medicine

## 2017-09-06 ENCOUNTER — Emergency Department: Payer: Medicare Other

## 2017-09-06 DIAGNOSIS — G301 Alzheimer's disease with late onset: Secondary | ICD-10-CM | POA: Insufficient documentation

## 2017-09-06 DIAGNOSIS — Z79899 Other long term (current) drug therapy: Secondary | ICD-10-CM | POA: Insufficient documentation

## 2017-09-06 DIAGNOSIS — R05 Cough: Secondary | ICD-10-CM | POA: Diagnosis present

## 2017-09-06 DIAGNOSIS — J069 Acute upper respiratory infection, unspecified: Secondary | ICD-10-CM | POA: Diagnosis not present

## 2017-09-06 NOTE — ED Provider Notes (Signed)
[facility] Emergency Department Provider Note  Time seen: 2:24 PM  I have reviewed the triage vital signs and the nursing notes.   HISTORY  Chief Complaint Cough and Nasal Congestion    HPI Beth Randall is a 84 y.o. female with a past medical history of mild dementia, presents to the emergency department for cough.  According to the daughter-in-law for the past 4 days the patient has been coughing, and for the past 2 days daughter states she can hear a rattling noise in her chest when she sleeps.  Denies any fever, nausea, vomiting, chest pain or abdominal pain, largely negative review of systems otherwise.  Currently the patient appears well, lying in bed, no distress with no complaints at this time.  Satting 97% on room air with a normal heart rate overall normal vitals.  Afebrile.   Past Medical History:  Diagnosis Date  . Breast cancer ([place])   . Dementia     Patient Active Problem List   Diagnosis Date Noted  . Ganglion cyst 07/02/2017  . Epidermoid cyst 07/02/2017  . 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 Onychomycosis 05/03/2017  . Acute pain of left knee 04/15/2017  . History of depression 03/29/2017  . Sleep disturbance 06/28/2016  . Poor balance 12/21/2015  . Gait disturbance 12/21/2015  . Dementia with behavioral disturbance 12/21/2015  . At high risk for falls 12/21/2015  . Anxiety, generalized 12/21/2015  . SSS (sick sinus syndrome) ([place]) 03/19/2014  . Late onset Alzheimer's disease with behavioral disturbance 06/10/2013  . Insomnia 06/10/2013    Past Surgical History:  Procedure Laterality Date  . BACK SURGERY    . MASTECTOMY Left     Prior to Admission medications   Medication Sig Start Date End Date Taking? Authorizing Provider  memantine (NAMENDA) 10 MG tablet Take 1 tablet by mouth 1 day or 1 dose.    [provider]  sertraline (ZOLOFT) 50 MG tablet Take 75 mg by mouth daily. 11/18/16   [provider]   traZODone (DESYREL) 50 MG tablet Take 50-100 mg by mouth as directed. 11/17/16   [provider]    Allergies  Allergen Reactions  . Ibuprofen Rash    Tongue swelling  . Aspirin   . Penicillins     .Has patient had a PCN reaction causing immediate rash, facial/tongue/throat swelling, SOB or lightheadedness with hypotension: Unknown Has patient had a PCN reaction causing severe rash involving mucus membranes or skin necrosis: Unknown Has patient had a PCN reaction that required hospitalization: Unknown Has patient had a PCN reaction occurring within the last 10 years: Unknown If all of the above answers are "NO", then may proceed with Cephalosporin use.     No family history on file.  Social History Social History   Tobacco Use  . Smoking status: Never Smoker  . Smokeless tobacco: Never Used  Substance Use Topics  . Alcohol use: No  . Drug use: No    Review of Systems Constitutional: Negative for fever. Eyes: Negative for visual complaints ENT: Negative for recent illness/congestion Cardiovascular: Negative for chest pain. Respiratory: Positive for cough.  Denies any shortness of breath Gastrointestinal: Negative for abdominal pain, vomiting  Genitourinary: Negative for urinary compaints Musculoskeletal: Negative for leg pain or swelling Skin: Negative for skin complaints  Neurological: Negative for headache All other ROS negative  ____________________________________________   PHYSICAL EXAM:  VITAL SIGNS: ED Triage Vitals  Enc Vitals Group     BP 09/06/17 1350 Marland Kitchen)  107/59     Pulse Rate 09/06/17 1350 77     Resp 09/06/17 1350 20     Temp 09/06/17 1350 (!) 97.5 F (36.4 C)     Temp Source 09/06/17 1350 Oral     SpO2 09/06/17 1350 97 %     Weight 09/06/17 1351 100 lb (45.4 kg)     Height 09/06/17 1351 5\' 6"  (1.676 m)     Head Circumference --      Peak Flow --      Pain Score 09/06/17 1351 0     Pain Loc --      Pain Edu? --      Excl. in [place]? --      Constitutional: Alert.  No distress.  Lying in bed comfortably. Eyes: Normal exam ENT   Head: Normocephalic and atraumatic.  No congestion   Mouth/Throat: Mucous membranes are moist. Cardiovascular: Normal rate, regular rhythm. No murmur Respiratory: Normal respiratory effort without tachypnea nor retractions. Breath sounds are clear.  No obvious wheeze rales or rhonchi. Gastrointestinal: Soft and nontender. No distention.  Musculoskeletal: Nontender with normal range of motion in all extremities. No lower extremity tenderness or edema.  Neurologic:  Normal speech and language. No gross focal neurologic deficits  Skin:  Skin is warm, dry and intact.  Psychiatric: Mood and affect are normal.   ____________________________________________    RADIOLOGY  Chest x-ray is normal.  ____________________________________________   INITIAL IMPRESSION / ASSESSMENT AND PLAN / ED COURSE  Pertinent labs & imaging results that were available during my care of the patient were reviewed by me and considered in my medical decision making (see chart for details).  Patient presents to the emergency department with cough for the past 4 days, daughter-in-law believes it is worse over the past 2 days.  Differential would include upper respiratory infection, bronchitis, pneumonia.  Patient was seen by her primary care doctor 2 days ago and prescribed Zithromax and cough medication which she has been using, but the daughter states minimal improvement which is why she brought her to the emergency department.  Overall the patient appears very well, no distress, lungs sound clear.  Vitals are reassuring.  Will obtain a chest x-ray as a precaution and continue to closely monitor.  If chest x-ray is clear anticipate likely discharge home with PCP follow-up.  Chest x-rays resulted largely normal.  Patient continues to appear well is already on antibiotics and cough medication.  We will discharge with PCP  follow-up.  Daughter-in-law and patient agreeable to this plan of care.  ____________________________________________   FINAL CLINICAL IMPRESSION(S) / ED DIAGNOSES  Upper respiratory infection    Harvest Dark, MD 09/06/17 1515

## 2017-09-06 NOTE — ED Notes (Signed)
DC instructions discussed with daughter-in-law and patient.  Verbalized understanding to follow up with PCP.

## 2017-09-06 NOTE — ED Notes (Signed)
Pt changing into gown, pt here with family, reports cold sxs for almost 2 weeks, has been to PCP. Pt hard of hearing.  No distress noted.

## 2017-09-06 NOTE — ED Triage Notes (Signed)
Patient presents to the ED with cough and congestion.  Patient's daughter in law states, "there is noise coming from her chest when she sleeps."  Patient is in no obvious distress at this time.  Patient denies any chest pain or issues.

## 2018-02-02 ENCOUNTER — Emergency Department: Payer: Medicare Other

## 2018-02-02 ENCOUNTER — Encounter: Payer: Self-pay | Admitting: Emergency Medicine

## 2018-02-02 ENCOUNTER — Inpatient Hospital Stay
Admission: EM | Admit: 2018-02-02 | Discharge: 2018-02-04 | DRG: 871 | Disposition: A | Discharge status: Home Health | Payer: Medicare Other | Attending: Internal Medicine | Admitting: Internal Medicine

## 2018-02-02 ENCOUNTER — Other Ambulatory Visit: Payer: Self-pay

## 2018-02-02 DIAGNOSIS — F0390 Unspecified dementia without behavioral disturbance: Secondary | ICD-10-CM | POA: Diagnosis present

## 2018-02-02 DIAGNOSIS — Z880 Allergy status to penicillin: Secondary | ICD-10-CM

## 2018-02-02 DIAGNOSIS — G9340 Encephalopathy, unspecified: Secondary | ICD-10-CM

## 2018-02-02 DIAGNOSIS — E871 Hypo-osmolality and hyponatremia: Secondary | ICD-10-CM | POA: Diagnosis present

## 2018-02-02 DIAGNOSIS — Z66 Do not resuscitate: Secondary | ICD-10-CM | POA: Diagnosis present

## 2018-02-02 DIAGNOSIS — A4150 Gram-negative sepsis, unspecified: Secondary | ICD-10-CM | POA: Diagnosis present

## 2018-02-02 DIAGNOSIS — Z79899 Other long term (current) drug therapy: Secondary | ICD-10-CM | POA: Diagnosis not present

## 2018-02-02 DIAGNOSIS — E43 Unspecified severe protein-calorie malnutrition: Secondary | ICD-10-CM

## 2018-02-02 DIAGNOSIS — R402143 Coma scale, eyes open, spontaneous, at hospital admission: Secondary | ICD-10-CM | POA: Diagnosis present

## 2018-02-02 DIAGNOSIS — A419 Sepsis, unspecified organism: Secondary | ICD-10-CM | POA: Diagnosis present

## 2018-02-02 DIAGNOSIS — R402363 Coma scale, best motor response, obeys commands, at hospital admission: Secondary | ICD-10-CM | POA: Diagnosis present

## 2018-02-02 DIAGNOSIS — R402253 Coma scale, best verbal response, oriented, at hospital admission: Secondary | ICD-10-CM | POA: Diagnosis present

## 2018-02-02 DIAGNOSIS — N3000 Acute cystitis without hematuria: Secondary | ICD-10-CM | POA: Diagnosis present

## 2018-02-02 DIAGNOSIS — N390 Urinary tract infection, site not specified: Secondary | ICD-10-CM

## 2018-02-02 DIAGNOSIS — I4519 Other right bundle-branch block: Secondary | ICD-10-CM | POA: Diagnosis present

## 2018-02-02 DIAGNOSIS — Z886 Allergy status to analgesic agent status: Secondary | ICD-10-CM | POA: Diagnosis not present

## 2018-02-02 LAB — URINALYSIS, COMPLETE (UACMP) WITH MICROSCOPIC
Bilirubin Urine: NEGATIVE
Glucose, UA: NEGATIVE mg/dL
Hgb urine dipstick: NEGATIVE
Ketones, ur: NEGATIVE mg/dL
Nitrite: NEGATIVE
Protein, ur: 100 mg/dL — AB
Specific Gravity, Urine: 1.014 (ref 1.005–1.030)
Squamous Epithelial / HPF: NONE SEEN (ref 0–5)
WBC, UA: >50 WBC/hpf — ABNORMAL HIGH (ref 0–5)
pH: 5 (ref 5.0–8.0)

## 2018-02-02 LAB — GLUCOSE, CAPILLARY: Glucose-Capillary: 189 mg/dL — ABNORMAL HIGH (ref 70–99)

## 2018-02-02 LAB — URINE DRUG SCREEN, QUALITATIVE (ARMC ONLY)
Amphetamines, Ur Screen: NOT DETECTED
Barbiturates, Ur Screen: NOT DETECTED
Cannabinoid 50 Ng, Ur ~~LOC~~: NOT DETECTED
Cocaine Metabolite,Ur ~~LOC~~: NOT DETECTED
MDMA (Ecstasy)Ur Screen: NOT DETECTED
Methadone Scn, Ur: NOT DETECTED
Opiate, Ur Screen: NOT DETECTED
Phencyclidine (PCP) Ur S: NOT DETECTED
Tricyclic, Ur Screen: NOT DETECTED

## 2018-02-02 LAB — BASIC METABOLIC PANEL
ANION GAP: 11 (ref 5–15)
BUN: 19 mg/dL (ref 8–23)
CALCIUM: 8.7 mg/dL — AB (ref 8.9–10.3)
CO2: 24 mmol/L (ref 22–32)
Chloride: 97 mmol/L — ABNORMAL LOW (ref 98–111)
Creatinine, Ser: 0.92 mg/dL (ref 0.44–1.00)
GFR calc Af Amer: >60 mL/min (ref >60)
GFR, EST NON AFRICAN AMERICAN: 53 mL/min — AB (ref >60)
Glucose, Bld: 189 mg/dL — ABNORMAL HIGH (ref 70–99)
Potassium: 3.9 mmol/L (ref 3.5–5.1)
SODIUM: 132 mmol/L — AB (ref 135–145)

## 2018-02-02 LAB — CBC
HCT: 32.5 % — ABNORMAL LOW (ref 35.0–47.0)
Hemoglobin: 10.9 g/dL — ABNORMAL LOW (ref 12.0–16.0)
MCH: 30.1 pg (ref 26.0–34.0)
MCHC: 33.7 g/dL (ref 32.0–36.0)
MCV: 89.4 fL (ref 80.0–100.0)
PLATELETS: 272 10*3/uL (ref 150–440)
RBC: 3.63 MIL/uL — ABNORMAL LOW (ref 3.80–5.20)
RDW: 12.7 % (ref 11.5–14.5)
WBC: 16.1 10*3/uL — AB (ref 3.6–11.0)

## 2018-02-02 LAB — LACTIC ACID, PLASMA: LACTIC ACID, VENOUS: 1.4 mmol/L (ref 0.5–1.9)

## 2018-02-02 MED ORDER — ACETAMINOPHEN 650 MG RE SUPP: 650.0000 mg | Freq: Four times a day (QID) | RECTAL | Status: DC | PRN

## 2018-02-02 MED ORDER — LACTATED RINGERS IV SOLN
INTRAVENOUS | Status: DC
  Administered 2018-02-02 – 2018-02-04 (×2): via INTRAVENOUS

## 2018-02-02 MED ORDER — MEMANTINE HCL 5 MG PO TABS
10.0000 mg | ORAL_TABLET | Freq: Every day | ORAL | Status: DC
  Administered 2018-02-03 – 2018-02-04 (×2): 10 mg via ORAL
  Filled 2018-02-02 (×3): qty 2

## 2018-02-02 MED ORDER — SODIUM CHLORIDE 0.9 % IV BOLUS
1000.0000 mL | Freq: Once | INTRAVENOUS | Status: AC
  Administered 2018-02-02: 1000 mL via INTRAVENOUS

## 2018-02-02 MED ORDER — SODIUM CHLORIDE 0.9 % IV SOLN
1.0000 g | Freq: Once | INTRAVENOUS | Status: AC
  Administered 2018-02-02: 1 g via INTRAVENOUS
  Filled 2018-02-02: qty 10

## 2018-02-02 MED ORDER — SODIUM CHLORIDE 0.9 % IV SOLN
1.0000 g | INTRAVENOUS | Status: DC
  Administered 2018-02-03: 1 g via INTRAVENOUS
  Filled 2018-02-02: qty 1
  Filled 2018-02-02: qty 10

## 2018-02-02 MED ORDER — ENOXAPARIN SODIUM 30 MG/0.3ML ~~LOC~~ SOLN
30.0000 mg | SUBCUTANEOUS | Status: DC
  Administered 2018-02-02 – 2018-02-03 (×2): 30 mg via SUBCUTANEOUS
  Filled 2018-02-02 (×2): qty 0.3

## 2018-02-02 MED ORDER — ACETAMINOPHEN 325 MG PO TABS: 650.0000 mg | ORAL_TABLET | Freq: Four times a day (QID) | ORAL | Status: DC | PRN

## 2018-02-02 MED ORDER — TRAZODONE HCL 50 MG PO TABS
50.0000 mg | ORAL_TABLET | Freq: Every evening | ORAL | Status: DC | PRN
  Filled 2018-02-02: qty 1

## 2018-02-02 MED ORDER — ACETAMINOPHEN 500 MG PO TABS
1000.0000 mg | ORAL_TABLET | Freq: Once | ORAL | Status: AC
Start: 2018-02-02 — End: 2018-02-02
  Administered 2018-02-02: 1000 mg via ORAL
  Filled 2018-02-02: qty 2

## 2018-02-02 MED ORDER — SERTRALINE HCL 50 MG PO TABS
75.0000 mg | ORAL_TABLET | Freq: Every day | ORAL | Status: DC
  Administered 2018-02-03 – 2018-02-04 (×2): 75 mg via ORAL
  Filled 2018-02-02 (×2): qty 2

## 2018-02-02 NOTE — Progress Notes (Signed)
Anticoagulation monitoring(Lovenox):   84 yo female ordered Lovenox 40 mg Q24h  Filed Weights   02/02/18 1500  Weight: 100 lb (45.4 kg)   BMI    Lab Results  Component Value Date   CREATININE 0.92 02/02/2018   CREATININE 0.75 03/11/2017   CREATININE 0.88 12/25/2014   Estimated Creatinine Clearance: 28.5 mL/min (by C-G formula based on SCr of 0.92 mg/dL). Hemoglobin & Hematocrit     Component Value Date/Time   HGB 10.9 (L) 02/02/2018 1503   HGB 13.5 09/16/2014 1010   HCT 32.5 (L) 02/02/2018 1503   HCT 42.2 09/16/2014 1010     Per Protocol for Patient with estCrcl < 30 ml/min and BMI < 40, will transition to Lovenox 30 mg Q24h.

## 2018-02-02 NOTE — ED Provider Notes (Signed)
[facility] Emergency Department Provider Note  ____________________________________________  Time seen: Approximately 4:29 PM  I have reviewed the triage vital signs and the nursing notes.   HISTORY  Chief Complaint Tremors and Fatigue  Level 5 caveat:  Portions of the history and physical were unable to be obtained due to dementia   HPI Beth Randall is a 84 y.o. female with a history of dementia who presents for evaluation of generalized weakness and increasing sleepiness.  According to the son and patient's caretaker patient has been complaining of neck pain for 4 days.  Patient does not speak much anymore.  She has been receiving Tylenol at home which seems to help.  Since yesterday she has been more somnolent  She has had decreased appetite and not eating as well.  No fever, no chills, vomiting or diarrhea.  She has had a mild cough.  No history of recurrent UTIs. Patient has been unable to walk today like she normal does at baseline. Has had severe intermittent shaking spells. No falls.    Past Medical History:  Diagnosis Date  . Breast cancer ([place])   . Dementia     Patient Active Problem List   Diagnosis Date Noted  . Sepsis ([place]) 02/02/2018  . Ganglion cyst 07/02/2017  . Epidermoid cyst 07/02/2017  . 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 Onychomycosis 05/03/2017  . Acute pain of left knee 04/15/2017  . History of depression 03/29/2017  . Sleep disturbance 06/28/2016  . Poor balance 12/21/2015  . Gait disturbance 12/21/2015  . Dementia with behavioral disturbance 12/21/2015  . At high risk for falls 12/21/2015  . Anxiety, generalized 12/21/2015  . SSS (sick sinus syndrome) ([place]) 03/19/2014  . Late onset Alzheimer's disease with behavioral disturbance 06/10/2013  . Insomnia 06/10/2013    Past Surgical History:  Procedure Laterality Date  . BACK SURGERY    . MASTECTOMY Left     Prior to Admission medications   Medication Sig  Start Date End Date Taking? Authorizing Provider  memantine (NAMENDA) 10 MG tablet Take 1 tablet by mouth 1 day or 1 dose.    [provider]  sertraline (ZOLOFT) 50 MG tablet Take 75 mg by mouth daily. 11/18/16   [provider]  traZODone (DESYREL) 50 MG tablet Take 50-100 mg by mouth as directed. 11/17/16   [provider]    Allergies Ibuprofen; Aspirin; and Penicillins  Family History  Problem Relation Age of Onset  . Hypertension Mother     Social History Social History   Tobacco Use  . Smoking status: Never Smoker  . Smokeless tobacco: Never Used  Substance Use Topics  . Alcohol use: No  . Drug use: No    Review of Systems  Constitutional: Negative for fever. + shaking, somnolent, weak Respiratory: Negative for shortness of breath. + cough Gastrointestinal: Negative for vomiting or diarrhea.  ____________________________________________   PHYSICAL EXAM:  VITAL SIGNS: ED Triage Vitals  Enc Vitals Group     BP 02/02/18 1459 (!) 93/57     Pulse Rate 02/02/18 1459 87     Resp 02/02/18 1459 16     Temp 02/02/18 1459 97.8 F (36.6 C)     Temp Source 02/02/18 1459 Oral     SpO2 02/02/18 1459 95 %     Weight 02/02/18 1500 100 lb (45.4 kg)     Height 02/02/18 1500 5\' 6"  (1.676 m)     Head Circumference --  Peak Flow --      Pain Score --      Pain Loc --      Pain Edu? --      Excl. in [place]? --     Constitutional: Sleeping, arouses easily, will grimace like she is pain but not answers questions HEENT:      Head: Normocephalic and atraumatic.         Eyes: Conjunctivae are normal. Sclera is non-icteric. Pinpoint pupils bilaterally      Mouth/Throat: Mucous membranes are moist.       Neck: Supple with no signs of meningismus. No midline cspine tenderness or stepoff Cardiovascular: Regular rate and rhythm. No murmurs, gallops, or rubs. 2+ symmetrical distal pulses are present in all extremities. No JVD. Respiratory: Normal  respiratory effort. Lungs are clear to auscultation bilaterally. No wheezes, crackles, or rhonchi.  Gastrointestinal: Soft, non tender, and non distended with positive bowel sounds. No rebound or guarding. Musculoskeletal: Nontender with normal range of motion in all extremities. No edema, cyanosis, or erythema of extremities. Neurologic:  Face is symmetric. Moving all extremities, follows commands.  Skin: Skin is warm, dry and intact. No rash noted. Psychiatric: Mood and affect are normal. Speech and behavior are normal.  ____________________________________________   LABS (all labs ordered are listed, but only abnormal results are displayed)  Labs Reviewed  BASIC METABOLIC PANEL - Abnormal; Notable for the following components:      Result Value   Sodium 132 (*)    Chloride 97 (*)    Glucose, Bld 189 (*)    Calcium 8.7 (*)    GFR calc non Af Amer 53 (*)    All other components within normal limits  CBC - Abnormal; Notable for the following components:   WBC 16.1 (*)    RBC 3.63 (*)    Hemoglobin 10.9 (*)    HCT 32.5 (*)    All other components within normal limits  URINALYSIS, COMPLETE (UACMP) WITH MICROSCOPIC - Abnormal; Notable for the following components:   Color, Urine YELLOW (*)    APPearance TURBID (*)    Protein, ur 100 (*)    Leukocytes, UA LARGE (*)    WBC, UA >50 (*)    Bacteria, UA MANY (*)    All other components within normal limits  GLUCOSE, CAPILLARY - Abnormal; Notable for the following components:   Glucose-Capillary 189 (*)    All other components within normal limits  URINE DRUG SCREEN, QUALITATIVE ([facility] ONLY) - Abnormal; Notable for the following components:   Benzodiazepine, Ur Scrn TEST NOT PERFORMED, REAGENT NOT AVAILABLE (*)    All other components within normal limits  URINE CULTURE  CULTURE, BLOOD (ROUTINE X 2)  CULTURE, BLOOD (ROUTINE X 2)  LACTIC ACID, PLASMA  LACTIC ACID, PLASMA  CBG MONITORING, ED    ____________________________________________  EKG  ED ECG REPORT I, Rudene Re, the attending physician, personally viewed and interpreted this ECG.  Normal sinus rhythm, rate of 92, left axis deviation, incomplete right bundle branch block, normal QTC, T wave inversions in V2.  Unchanged from prior. ____________________________________________  RADIOLOGY  I have personally reviewed the images performed during this visit and I agree with the Radiologist's read.   Interpretation by Radiologist:  Dg Chest 2 View  Result Date: 02/02/2018 CLINICAL DATA:  84 year old who woke up this morning with tremors and who has been somnolent all day. Personal history of RIGHT breast cancer. EXAM: CHEST - 2 VIEW COMPARISON:  09/06/2017, 07/05/2017 and earlier.  FINDINGS: AP ERECT and LATERAL images were obtained. Cardiac silhouette upper normal in size for the AP technique, unchanged. Thoracic aorta atherosclerotic, unchanged. Hilar and mediastinal contours otherwise unremarkable. Minimal atelectasis involving the lower lobes. Lungs otherwise clear. Pulmonary vascularity normal. Bronchovascular markings normal. No pleural effusions. Degenerative changes involving the thoracic and UPPER lumbar spine. IMPRESSION: Minimal atelectasis involving the lower lobes. No acute cardiopulmonary disease otherwise. Electronically Signed   By: Evangeline Dakin M.D.   On: 02/02/2018 18:03   Ct Head Wo Contrast  Result Date: 02/02/2018 CLINICAL DATA:  84 year old who woke up this morning with tremors, acute mental status changes with confusion, and neck pain. Patient has been somnolent all day and is hypotensive upon arrival to the emergency department. EXAM: CT HEAD WITHOUT CONTRAST CT CERVICAL SPINE WITHOUT CONTRAST TECHNIQUE: Multidetector CT imaging of the head and cervical spine was performed following the standard protocol without intravenous contrast. Multiplanar CT image reconstructions of the cervical spine were  also generated. COMPARISON:  CT head 06/28/2017, 11/28/2016 and earlier. CT cervical spine 11/28/2016, 12/25/2014. FINDINGS: CT HEAD FINDINGS Patient motion blurred images of the skull base initially, but the images were repeated and a diagnostic study was obtained. Brain: Severe age related cortical atrophy, mild deep atrophy and mild cerebellar atrophy, unchanged. Moderate to severe changes of small vessel disease of the white matter diffusely, progressive since 2016. No mass lesion. No midline shift. No acute hemorrhage or hematoma. No extra-axial fluid collections. No evidence of acute infarction. Vascular: Moderate to severe BILATERAL carotid siphon atherosclerosis. No hyperdense vessel. Skull: No skull fractures identified. No intrinsic osseous abnormality. Visualized paranasal sinuses well aerated. Nasal bones intact. Normal-appearing sella turcica. CT Sinuses/Orbits: Prior LEFT maxillary medial antrostomy and LEFT ethmoidectomy. Visualized paranasal sinuses, bilateral mastoid air cells and bilateral middle ear cavities well-aerated. Visualized orbits and globes normal in appearance. Other: None. CT CERVICAL SPINE FINDINGS Alignment: Straightening of the usual lordosis. Anatomic POSTERIOR alignment. Skull base and vertebrae: No fractures identified involving the cervical spine. Facet joints anatomically aligned throughout with degenerative changes. Coronal reformatted images demonstrate an intact craniocervical junction, intact dens and intact lateral masses throughout. Soft tissues and spinal canal: No evidence of paraspinous or spinal canal hematoma. No evidence of spinal stenosis. Disc levels: Severe degenerative changes at the C1-C2 articulation. Well-preserved disc spaces throughout the cervical spine with mild narrowing at C6-7, unchanged. Calcification within all of the cervical discs. Combination of facet and uncinate hypertrophy account for mild BILATERAL C3-4 and moderate LEFT C4-5 foraminal  stenoses. Upper chest: Severe RIGHT and mild LEFT apical pleuroparenchymal scarring, unchanged. Visualized superior mediastinum unremarkable. Other: None. IMPRESSION: CT Head: 1. No acute intracranial abnormality. 2. Severe age related cortical atrophy, mild deep atrophy and mild cerebellar atrophy. Moderate to severe chronic microvascular ischemic changes of the white matter. CT Cervical Spine: 1. No cervical spine fractures identified. 2. Degenerative changes involving the cervical spine as detailed above. Electronically Signed   By: Evangeline Dakin M.D.   On: 02/02/2018 17:47   Ct Cervical Spine Wo Contrast  Result Date: 02/02/2018 CLINICAL DATA:  84 year old who woke up this morning with tremors, acute mental status changes with confusion, and neck pain. Patient has been somnolent all day and is hypotensive upon arrival to the emergency department. EXAM: CT HEAD WITHOUT CONTRAST CT CERVICAL SPINE WITHOUT CONTRAST TECHNIQUE: Multidetector CT imaging of the head and cervical spine was performed following the standard protocol without intravenous contrast. Multiplanar CT image reconstructions of the cervical spine were also generated. COMPARISON:  CT head 06/28/2017, 11/28/2016 and earlier. CT cervical spine 11/28/2016, 12/25/2014. FINDINGS: CT HEAD FINDINGS Patient motion blurred images of the skull base initially, but the images were repeated and a diagnostic study was obtained. Brain: Severe age related cortical atrophy, mild deep atrophy and mild cerebellar atrophy, unchanged. Moderate to severe changes of small vessel disease of the white matter diffusely, progressive since 2016. No mass lesion. No midline shift. No acute hemorrhage or hematoma. No extra-axial fluid collections. No evidence of acute infarction. Vascular: Moderate to severe BILATERAL carotid siphon atherosclerosis. No hyperdense vessel. Skull: No skull fractures identified. No intrinsic osseous abnormality. Visualized paranasal sinuses well  aerated. Nasal bones intact. Normal-appearing sella turcica. CT Sinuses/Orbits: Prior LEFT maxillary medial antrostomy and LEFT ethmoidectomy. Visualized paranasal sinuses, bilateral mastoid air cells and bilateral middle ear cavities well-aerated. Visualized orbits and globes normal in appearance. Other: None. CT CERVICAL SPINE FINDINGS Alignment: Straightening of the usual lordosis. Anatomic POSTERIOR alignment. Skull base and vertebrae: No fractures identified involving the cervical spine. Facet joints anatomically aligned throughout with degenerative changes. Coronal reformatted images demonstrate an intact craniocervical junction, intact dens and intact lateral masses throughout. Soft tissues and spinal canal: No evidence of paraspinous or spinal canal hematoma. No evidence of spinal stenosis. Disc levels: Severe degenerative changes at the C1-C2 articulation. Well-preserved disc spaces throughout the cervical spine with mild narrowing at C6-7, unchanged. Calcification within all of the cervical discs. Combination of facet and uncinate hypertrophy account for mild BILATERAL C3-4 and moderate LEFT C4-5 foraminal stenoses. Upper chest: Severe RIGHT and mild LEFT apical pleuroparenchymal scarring, unchanged. Visualized superior mediastinum unremarkable. Other: None. IMPRESSION: CT Head: 1. No acute intracranial abnormality. 2. Severe age related cortical atrophy, mild deep atrophy and mild cerebellar atrophy. Moderate to severe chronic microvascular ischemic changes of the white matter. CT Cervical Spine: 1. No cervical spine fractures identified. 2. Degenerative changes involving the cervical spine as detailed above. Electronically Signed   By: Evangeline Dakin M.D.   On: 02/02/2018 17:47      ____________________________________________   PROCEDURES  Procedure(s) performed: None Procedures Critical Care performed: yes  CRITICAL CARE Performed by: Rudene Re  ?  Total critical care time:  35 min  Critical care time was exclusive of separately billable procedures and treating other patients.  Critical care was necessary to treat or prevent imminent or life-threatening deterioration.  Critical care was time spent personally by me on the following activities: development of treatment plan with patient and/or surrogate as well as nursing, discussions with consultants, evaluation of patient's response to treatment, examination of patient, obtaining history from patient or surrogate, ordering and performing treatments and interventions, ordering and review of laboratory studies, ordering and review of radiographic studies, pulse oximetry and re-evaluation of patient's condition.  ____________________________________________   INITIAL IMPRESSION / ASSESSMENT AND PLAN / ED COURSE  84 y.o. female with a history of dementia who presents for evaluation of generalized weakness and increasing sleepiness.  Patient is easily arousable, not answering any questions, intermittently she grimaces like she is in pain but is unable to provide any details, she will follow commands with all 4 extremities.  Her vital signs that show slightly low blood pressure however review of epic shows that this is where patient usually lives.  There is no signs of trauma.  She does have pinpoint pupils and according to the family she is not on any narcotics.  Will check a drug screen, will do CT head and cervical spine due to her recent complaints  of neck pain.  Will check labs and urinalysis for any signs of dehydration, infection, anemia, or electrolyte abnormalities.  Will give IV fluids.   ED COURSE: UA positive for UTI, patient with leukocytosis and low blood pressure concerning for sepsis.  Imaging studies with no acute findings.  Patient was given Rocephin, 2 L of IV fluid and admitted to the hospitalist for further management per   As part of my medical decision making, I reviewed the following data within the  [place] History obtained from family, Nursing notes reviewed and incorporated, Labs reviewed , EKG interpreted , Old chart reviewed, Radiograph reviewed , Discussed with admitting physician , Notes from prior ED visits and [place] Controlled Substance Database    Pertinent labs & imaging results that were available during my care of the patient were reviewed by me and considered in my medical decision making (see chart for details).    ____________________________________________   FINAL CLINICAL IMPRESSION(S) / ED DIAGNOSES  Final diagnoses:  Sepsis due to urinary tract infection ([place])  Encephalopathy acute      NEW MEDICATIONS STARTED DURING THIS VISIT:  ED Discharge Orders    None       Note:  This document was prepared using Dragon voice recognition software and may include unintentional dictation errors.    Rudene Re, MD 02/02/18 2034

## 2018-02-02 NOTE — H&P (Signed)
[facility] [place] NAME: Beth Randall    MR#:  [id number]  DATE OF BIRTH:  [birth year]  DATE OF ADMISSION:  02/02/2018  PRIMARY CARE PHYSICIAN: Mortimer Fries  REQUESTING/REFERRING PHYSICIAN: Dr Gonzella Lex  CHIEF COMPLAINT:   Chief Complaint  Patient presents with  . Tremors  . Fatigue    HISTORY OF PRESENT ILLNESS:  Beth Randall  is a 84 y.o. female with dementia.  Patient unable to give me any history.  As per family last night had some shaking episodes.  She woke up this morning and needed help to even walk and was put in a wheelchair.  She had some shakes and some sweating this afternoon.  She was able to eat.  She has been having some soreness on the right side of the neck and they have been giving some Tylenol.  PAST MEDICAL HISTORY:   Past Medical History:  Diagnosis Date  . Breast cancer ([place])   . Dementia     PAST SURGICAL HISTORY:   Past Surgical History:  Procedure Laterality Date  . BACK SURGERY    . MASTECTOMY Left     SOCIAL HISTORY:   Social History   Tobacco Use  . Smoking status: Never Smoker  . Smokeless tobacco: Never Used  Substance Use Topics  . Alcohol use: No    FAMILY HISTORY:   Family History  Problem Relation Age of Onset  . Hypertension Mother     DRUG ALLERGIES:   Allergies  Allergen Reactions  . Ibuprofen Rash    Tongue swelling  . Aspirin   . Penicillins     .Has patient had a PCN reaction causing immediate rash, facial/tongue/throat swelling, SOB or lightheadedness with hypotension: Unknown Has patient had a PCN reaction causing severe rash involving mucus membranes or skin necrosis: Unknown Has patient had a PCN reaction that required hospitalization: Unknown Has patient had a PCN reaction occurring within the last 10 years: Unknown If all of the above answers are "NO", then may proceed with Cephalosporin use.     REVIEW OF SYSTEMS:   Patient unable to  provide review of systems secondary to dementia and altered mental status  MEDICATIONS AT HOME:   Prior to Admission medications   Medication Sig Start Date End Date Taking? Authorizing Provider  memantine (NAMENDA) 10 MG tablet Take 1 tablet by mouth 1 day or 1 dose.    [provider]  sertraline (ZOLOFT) 50 MG tablet Take 75 mg by mouth daily. 11/18/16   [provider]  traZODone (DESYREL) 50 MG tablet Take 50-100 mg by mouth as directed. 11/17/16   [provider]      VITAL SIGNS:  Blood pressure 106/60, pulse 69, temperature 97.8 F (36.6 C), temperature source Oral, resp. rate 15, height 5\' 6"  (1.676 m), weight 45.4 kg, SpO2 96 %.  PHYSICAL EXAMINATION:  GENERAL:  85 y.o.-year-old patient lying in the bed with no acute distress.  EYES: Pupils equal, round, reactive to light and accommodation. No scleral icterus.  HEENT: Head atraumatic, normocephalic. Oropharynx and nasopharynx clear.  NECK:  Supple, no jugular venous distention. No thyroid enlargement, no tenderness.  LUNGS: Normal breath sounds bilaterally, no wheezing, rales,rhonchi or crepitation. No use of accessory muscles of respiration.  CARDIOVASCULAR: S1, S2 normal. No murmurs, rubs, or gallops.  ABDOMEN: Soft, nontender, nondistended. Bowel sounds present. No organomegaly or mass.  EXTREMITIES: No pedal edema, cyanosis, or clubbing.  NEUROLOGIC: Lethargic and tries to  talk PSYCHIATRIC: The patient is lethargic.  SKIN: No rash, lesion, or ulcer.   LABORATORY PANEL:   CBC Recent Labs  Lab 02/02/18 1503  WBC 16.1*  HGB 10.9*  HCT 32.5*  PLT 272   ------------------------------------------------------------------------------------------------------------------  Chemistries  Recent Labs  Lab 02/02/18 1503  NA 132*  K 3.9  CL 97*  CO2 24  GLUCOSE 189*  BUN 19  CREATININE 0.92  CALCIUM 8.7*    ------------------------------------------------------------------------------------------------------------------  Left  RADIOLOGY:  Dg Chest 2 View  Result Date: 02/02/2018 CLINICAL DATA:  84 year old who woke up this morning with tremors and who has been somnolent all day. Personal history of RIGHT breast cancer. EXAM: CHEST - 2 VIEW COMPARISON:  09/06/2017, 07/05/2017 and earlier. FINDINGS: AP ERECT and LATERAL images were obtained. Cardiac silhouette upper normal in size for the AP technique, unchanged. Thoracic aorta atherosclerotic, unchanged. Hilar and mediastinal contours otherwise unremarkable. Minimal atelectasis involving the lower lobes. Lungs otherwise clear. Pulmonary vascularity normal. Bronchovascular markings normal. No pleural effusions. Degenerative changes involving the thoracic and UPPER lumbar spine. IMPRESSION: Minimal atelectasis involving the lower lobes. No acute cardiopulmonary disease otherwise. Electronically Signed   By: Evangeline Dakin M.D.   On: 02/02/2018 18:03   Ct Head Wo Contrast  Result Date: 02/02/2018 CLINICAL DATA:  84 year old who woke up this morning with tremors, acute mental status changes with confusion, and neck pain. Patient has been somnolent all day and is hypotensive upon arrival to the emergency department. EXAM: CT HEAD WITHOUT CONTRAST CT CERVICAL SPINE WITHOUT CONTRAST TECHNIQUE: Multidetector CT imaging of the head and cervical spine was performed following the standard protocol without intravenous contrast. Multiplanar CT image reconstructions of the cervical spine were also generated. COMPARISON:  CT head 06/28/2017, 11/28/2016 and earlier. CT cervical spine 11/28/2016, 12/25/2014. FINDINGS: CT HEAD FINDINGS Patient motion blurred images of the skull base initially, but the images were repeated and a diagnostic study was obtained. Brain: Severe age related cortical atrophy, mild deep atrophy and mild cerebellar atrophy, unchanged. Moderate to  severe changes of small vessel disease of the white matter diffusely, progressive since 2016. No mass lesion. No midline shift. No acute hemorrhage or hematoma. No extra-axial fluid collections. No evidence of acute infarction. Vascular: Moderate to severe BILATERAL carotid siphon atherosclerosis. No hyperdense vessel. Skull: No skull fractures identified. No intrinsic osseous abnormality. Visualized paranasal sinuses well aerated. Nasal bones intact. Normal-appearing sella turcica. CT Sinuses/Orbits: Prior LEFT maxillary medial antrostomy and LEFT ethmoidectomy. Visualized paranasal sinuses, bilateral mastoid air cells and bilateral middle ear cavities well-aerated. Visualized orbits and globes normal in appearance. Other: None. CT CERVICAL SPINE FINDINGS Alignment: Straightening of the usual lordosis. Anatomic POSTERIOR alignment. Skull base and vertebrae: No fractures identified involving the cervical spine. Facet joints anatomically aligned throughout with degenerative changes. Coronal reformatted images demonstrate an intact craniocervical junction, intact dens and intact lateral masses throughout. Soft tissues and spinal canal: No evidence of paraspinous or spinal canal hematoma. No evidence of spinal stenosis. Disc levels: Severe degenerative changes at the C1-C2 articulation. Well-preserved disc spaces throughout the cervical spine with mild narrowing at C6-7, unchanged. Calcification within all of the cervical discs. Combination of facet and uncinate hypertrophy account for mild BILATERAL C3-4 and moderate LEFT C4-5 foraminal stenoses. Upper chest: Severe RIGHT and mild LEFT apical pleuroparenchymal scarring, unchanged. Visualized superior mediastinum unremarkable. Other: None. IMPRESSION: CT Head: 1. No acute intracranial abnormality. 2. Severe age related cortical atrophy, mild deep atrophy and mild cerebellar atrophy. Moderate to severe chronic microvascular ischemic changes  of the white matter. CT  Cervical Spine: 1. No cervical spine fractures identified. 2. Degenerative changes involving the cervical spine as detailed above. Electronically Signed   By: Evangeline Dakin M.D.   On: 02/02/2018 17:47   Ct Cervical Spine Wo Contrast  Result Date: 02/02/2018 CLINICAL DATA:  84 year old who woke up this morning with tremors, acute mental status changes with confusion, and neck pain. Patient has been somnolent all day and is hypotensive upon arrival to the emergency department. EXAM: CT HEAD WITHOUT CONTRAST CT CERVICAL SPINE WITHOUT CONTRAST TECHNIQUE: Multidetector CT imaging of the head and cervical spine was performed following the standard protocol without intravenous contrast. Multiplanar CT image reconstructions of the cervical spine were also generated. COMPARISON:  CT head 06/28/2017, 11/28/2016 and earlier. CT cervical spine 11/28/2016, 12/25/2014. FINDINGS: CT HEAD FINDINGS Patient motion blurred images of the skull base initially, but the images were repeated and a diagnostic study was obtained. Brain: Severe age related cortical atrophy, mild deep atrophy and mild cerebellar atrophy, unchanged. Moderate to severe changes of small vessel disease of the white matter diffusely, progressive since 2016. No mass lesion. No midline shift. No acute hemorrhage or hematoma. No extra-axial fluid collections. No evidence of acute infarction. Vascular: Moderate to severe BILATERAL carotid siphon atherosclerosis. No hyperdense vessel. Skull: No skull fractures identified. No intrinsic osseous abnormality. Visualized paranasal sinuses well aerated. Nasal bones intact. Normal-appearing sella turcica. CT Sinuses/Orbits: Prior LEFT maxillary medial antrostomy and LEFT ethmoidectomy. Visualized paranasal sinuses, bilateral mastoid air cells and bilateral middle ear cavities well-aerated. Visualized orbits and globes normal in appearance. Other: None. CT CERVICAL SPINE FINDINGS Alignment: Straightening of the usual  lordosis. Anatomic POSTERIOR alignment. Skull base and vertebrae: No fractures identified involving the cervical spine. Facet joints anatomically aligned throughout with degenerative changes. Coronal reformatted images demonstrate an intact craniocervical junction, intact dens and intact lateral masses throughout. Soft tissues and spinal canal: No evidence of paraspinous or spinal canal hematoma. No evidence of spinal stenosis. Disc levels: Severe degenerative changes at the C1-C2 articulation. Well-preserved disc spaces throughout the cervical spine with mild narrowing at C6-7, unchanged. Calcification within all of the cervical discs. Combination of facet and uncinate hypertrophy account for mild BILATERAL C3-4 and moderate LEFT C4-5 foraminal stenoses. Upper chest: Severe RIGHT and mild LEFT apical pleuroparenchymal scarring, unchanged. Visualized superior mediastinum unremarkable. Other: None. IMPRESSION: CT Head: 1. No acute intracranial abnormality. 2. Severe age related cortical atrophy, mild deep atrophy and mild cerebellar atrophy. Moderate to severe chronic microvascular ischemic changes of the white matter. CT Cervical Spine: 1. No cervical spine fractures identified. 2. Degenerative changes involving the cervical spine as detailed above. Electronically Signed   By: Evangeline Dakin M.D.   On: 02/02/2018 17:47    EKG:   Sinus rhythm 92 bpm, left axis deviation, incomplete right bundle branch block  IMPRESSION AND PLAN:   1.  Clinical sepsis, acute cystitis without hematuria, leukocytosis hypotension.  IV fluid hydration IV Rocephin.  Patient is a DO NOT RESUSCITATE. 2.  Dementia.  On Namenda, Zoloft and trazodone 3.  Hyponatremia fluid should help  All the records are reviewed and case discussed with ED provider. Management plans discussed with the patient, family and they are in agreement.  CODE STATUS: DNR  TOTAL TIME TAKING CARE OF THIS PATIENT: 50 minutes, including ACP time.     Loletha Grayer M.D on 02/02/2018 at 7:14 PM  Between 7am to 6pm - Pager - [phone]  After 6pm call admission pager [phone]  [facility]  [phone]  CC: Primary care physician; Leonel Ramsay, MD

## 2018-02-02 NOTE — Progress Notes (Signed)
Patient ID: Beth Randall, female   DOB: 1924/04/27, 84 y.o.   MRN: [id number]  ACP note  Patient unable to participate in conversation.  Family at the bedside  Diagnosis: Sepsis, acute cystitis, leukocytosis, hypotension, dementia  CODE STATUS discussed and patient made a DO NOT RESUSCITATE  Plan.  IV fluid hydration and IV Rocephin for urinary tract infection follow-up urine culture.  Time spent on ACP discussion 17 minutes Dr. Loletha Grayer

## 2018-02-02 NOTE — Progress Notes (Signed)
CODE SEPSIS - PHARMACY COMMUNICATION  **Broad Spectrum Antibiotics should be administered within 1 hour of Sepsis diagnosis**  Time Code Sepsis Called/Page Received:  9/1 @ 18:00   Antibiotics Ordered: Ceftriaxone   Time of 1st antibiotic administration: 9/1 @ 18:38   Additional action taken by pharmacy: none  If necessary, Name of Provider/Nurse Contacted:     Avenell Sellers D ,PharmD Clinical Pharmacist  02/02/2018  7:41 PM

## 2018-02-02 NOTE — ED Notes (Signed)
Pt in with family that states that she has not been herself for the last couple of days. Family states that she has been very sluggish and not wanting to talk.

## 2018-02-02 NOTE — ED Triage Notes (Signed)
Pt to ED via POV with son and dtr in law who state that pt woke up this morning with tremors.Pt son also states that pt is having trouble staying awake. Pt is falling asleep in triage. Pt is hypotensive in triage at 93/57. Pt is in NAD at this time.

## 2018-02-03 ENCOUNTER — Other Ambulatory Visit: Payer: Self-pay

## 2018-02-03 LAB — BASIC METABOLIC PANEL
ANION GAP: 8 (ref 5–15)
BUN: 16 mg/dL (ref 8–23)
CHLORIDE: 105 mmol/L (ref 98–111)
CO2: 25 mmol/L (ref 22–32)
Calcium: 8 mg/dL — ABNORMAL LOW (ref 8.9–10.3)
Creatinine, Ser: 0.67 mg/dL (ref 0.44–1.00)
GFR calc non Af Amer: >60 mL/min (ref >60)
Glucose, Bld: 115 mg/dL — ABNORMAL HIGH (ref 70–99)
Potassium: 3.8 mmol/L (ref 3.5–5.1)
Sodium: 138 mmol/L (ref 135–145)

## 2018-02-03 LAB — CBC
HCT: 29.3 % — ABNORMAL LOW (ref 35.0–47.0)
HEMOGLOBIN: 9.9 g/dL — AB (ref 12.0–16.0)
MCH: 29.9 pg (ref 26.0–34.0)
MCHC: 33.6 g/dL (ref 32.0–36.0)
MCV: 89 fL (ref 80.0–100.0)
Platelets: 245 10*3/uL (ref 150–440)
RBC: 3.29 MIL/uL — AB (ref 3.80–5.20)
RDW: 12.9 % (ref 11.5–14.5)
WBC: 13.6 10*3/uL — ABNORMAL HIGH (ref 3.6–11.0)

## 2018-02-03 MED ORDER — ADULT MULTIVITAMIN W/MINERALS CH
1.0000 | ORAL_TABLET | Freq: Every day | ORAL | Status: DC
  Administered 2018-02-04: 1 via ORAL
  Filled 2018-02-03: qty 1

## 2018-02-03 MED ORDER — ENSURE ENLIVE PO LIQD
237.0000 mL | Freq: Three times a day (TID) | ORAL | Status: DC
  Administered 2018-02-04: 237 mL via ORAL

## 2018-02-03 MED ORDER — SODIUM CHLORIDE 0.9% FLUSH
3.0000 mL | Freq: Two times a day (BID) | INTRAVENOUS | Status: DC
  Administered 2018-02-03: 3 mL via INTRAVENOUS

## 2018-02-03 NOTE — Progress Notes (Signed)
[facility] NAME: Beth Randall    MR#:  [id number]  DATE OF BIRTH:  [birth year]  SUBJECTIVE:  CHIEF COMPLAINT:   Chief Complaint  Patient presents with  . Tremors  . Fatigue  Patient seen today No fever Generalized weakness Tolerating diet well No chest pain She was worked up with CT head which showed no acute abnormality CT cervical spine degenerative disc disease  REVIEW OF SYSTEMS:    ROS  CONSTITUTIONAL: No documented fever. Has fatigue, and weakness. No weight gain, no weight loss.  EYES: No blurry or double vision.  ENT: No tinnitus. No postnasal drip. No redness of the oropharynx.  RESPIRATORY: No cough, no wheeze, no hemoptysis. No dyspnea.  CARDIOVASCULAR: No chest pain. No orthopnea. No palpitations. No syncope.  GASTROINTESTINAL: No nausea, no vomiting or diarrhea. No abdominal pain. No melena or hematochezia.  GENITOURINARY: Has dysuria , no hematuria.  ENDOCRINE: No polyuria or nocturia. No heat or cold intolerance.  HEMATOLOGY: No anemia. No bruising. No bleeding.  INTEGUMENTARY: No rashes. No lesions.  MUSCULOSKELETAL: No arthritis. No swelling. No gout.  NEUROLOGIC: No numbness, tingling, or ataxia. No seizure-type activity.  PSYCHIATRIC: No anxiety. No insomnia. No ADD.   DRUG ALLERGIES:   Allergies  Allergen Reactions  . Ibuprofen Rash    Tongue swelling  . Aspirin   . Penicillins     .Has patient had a PCN reaction causing immediate rash, facial/tongue/throat swelling, SOB or lightheadedness with hypotension: Unknown Has patient had a PCN reaction causing severe rash involving mucus membranes or skin necrosis: Unknown Has patient had a PCN reaction that required hospitalization: Unknown Has patient had a PCN reaction occurring within the last 10 years: Unknown If all of the above answers are "NO", then may proceed with Cephalosporin use.     VITALS:  Blood pressure (!) 101/30, pulse 83,  temperature 98.6 F (37 C), temperature source Oral, resp. rate 18, height 5\' 6"  (1.676 m), weight 45.6 kg, SpO2 96 %.  PHYSICAL EXAMINATION:   Physical Exam  GENERAL:  84 y.o.-year-old patient lying in the bed with no acute distress.  EYES: Pupils equal, round, reactive to light and accommodation. No scleral icterus. Extraocular muscles intact.  HEENT: Head atraumatic, normocephalic. Oropharynx and nasopharynx clear.  NECK:  Supple, no jugular venous distention. No thyroid enlargement, no tenderness.  LUNGS: Normal breath sounds bilaterally, no wheezing, rales, rhonchi. No use of accessory muscles of respiration.  CARDIOVASCULAR: S1, S2 normal. No murmurs, rubs, or gallops.  ABDOMEN: Soft, nontender, nondistended. Bowel sounds present. No organomegaly or mass.  EXTREMITIES: No cyanosis, clubbing or edema b/l.    NEUROLOGIC: Cranial nerves II through XII are intact. No focal Motor or sensory deficits b/l.   PSYCHIATRIC: The patient is alert and oriented x 3.  SKIN: No obvious rash, lesion, or ulcer.   LABORATORY PANEL:   CBC Recent Labs  Lab 02/03/18 0417  WBC 13.6*  HGB 9.9*  HCT 29.3*  PLT 245   ------------------------------------------------------------------------------------------------------------------ Chemistries  Recent Labs  Lab 02/03/18 0417  NA 138  K 3.8  CL 105  CO2 25  GLUCOSE 115*  BUN 16  CREATININE 0.67  CALCIUM 8.0*   ------------------------------------------------------------------------------------------------------------------  Cardiac Enzymes No results for input(s): TROPONINI in the last 168 hours. ------------------------------------------------------------------------------------------------------------------  RADIOLOGY:  Dg Chest 2 View  Result Date: 02/02/2018 CLINICAL DATA:  84 year old who woke up this morning with tremors and who has been somnolent all day. Personal history of  RIGHT breast cancer. EXAM: CHEST - 2 VIEW COMPARISON:   09/06/2017, 07/05/2017 and earlier. FINDINGS: AP ERECT and LATERAL images were obtained. Cardiac silhouette upper normal in size for the AP technique, unchanged. Thoracic aorta atherosclerotic, unchanged. Hilar and mediastinal contours otherwise unremarkable. Minimal atelectasis involving the lower lobes. Lungs otherwise clear. Pulmonary vascularity normal. Bronchovascular markings normal. No pleural effusions. Degenerative changes involving the thoracic and UPPER lumbar spine. IMPRESSION: Minimal atelectasis involving the lower lobes. No acute cardiopulmonary disease otherwise. Electronically Signed   By: Evangeline Dakin M.D.   On: 02/02/2018 18:03   Ct Head Wo Contrast  Result Date: 02/02/2018 CLINICAL DATA:  84 year old who woke up this morning with tremors, acute mental status changes with confusion, and neck pain. Patient has been somnolent all day and is hypotensive upon arrival to the emergency department. EXAM: CT HEAD WITHOUT CONTRAST CT CERVICAL SPINE WITHOUT CONTRAST TECHNIQUE: Multidetector CT imaging of the head and cervical spine was performed following the standard protocol without intravenous contrast. Multiplanar CT image reconstructions of the cervical spine were also generated. COMPARISON:  CT head 06/28/2017, 11/28/2016 and earlier. CT cervical spine 11/28/2016, 12/25/2014. FINDINGS: CT HEAD FINDINGS Patient motion blurred images of the skull base initially, but the images were repeated and a diagnostic study was obtained. Brain: Severe age related cortical atrophy, mild deep atrophy and mild cerebellar atrophy, unchanged. Moderate to severe changes of small vessel disease of the white matter diffusely, progressive since 2016. No mass lesion. No midline shift. No acute hemorrhage or hematoma. No extra-axial fluid collections. No evidence of acute infarction. Vascular: Moderate to severe BILATERAL carotid siphon atherosclerosis. No hyperdense vessel. Skull: No skull fractures identified. No  intrinsic osseous abnormality. Visualized paranasal sinuses well aerated. Nasal bones intact. Normal-appearing sella turcica. CT Sinuses/Orbits: Prior LEFT maxillary medial antrostomy and LEFT ethmoidectomy. Visualized paranasal sinuses, bilateral mastoid air cells and bilateral middle ear cavities well-aerated. Visualized orbits and globes normal in appearance. Other: None. CT CERVICAL SPINE FINDINGS Alignment: Straightening of the usual lordosis. Anatomic POSTERIOR alignment. Skull base and vertebrae: No fractures identified involving the cervical spine. Facet joints anatomically aligned throughout with degenerative changes. Coronal reformatted images demonstrate an intact craniocervical junction, intact dens and intact lateral masses throughout. Soft tissues and spinal canal: No evidence of paraspinous or spinal canal hematoma. No evidence of spinal stenosis. Disc levels: Severe degenerative changes at the C1-C2 articulation. Well-preserved disc spaces throughout the cervical spine with mild narrowing at C6-7, unchanged. Calcification within all of the cervical discs. Combination of facet and uncinate hypertrophy account for mild BILATERAL C3-4 and moderate LEFT C4-5 foraminal stenoses. Upper chest: Severe RIGHT and mild LEFT apical pleuroparenchymal scarring, unchanged. Visualized superior mediastinum unremarkable. Other: None. IMPRESSION: CT Head: 1. No acute intracranial abnormality. 2. Severe age related cortical atrophy, mild deep atrophy and mild cerebellar atrophy. Moderate to severe chronic microvascular ischemic changes of the white matter. CT Cervical Spine: 1. No cervical spine fractures identified. 2. Degenerative changes involving the cervical spine as detailed above. Electronically Signed   By: Evangeline Dakin M.D.   On: 02/02/2018 17:47   Ct Cervical Spine Wo Contrast  Result Date: 02/02/2018 CLINICAL DATA:  84 year old who woke up this morning with tremors, acute mental status changes with  confusion, and neck pain. Patient has been somnolent all day and is hypotensive upon arrival to the emergency department. EXAM: CT HEAD WITHOUT CONTRAST CT CERVICAL SPINE WITHOUT CONTRAST TECHNIQUE: Multidetector CT imaging of the head and cervical spine was performed following the standard protocol without  intravenous contrast. Multiplanar CT image reconstructions of the cervical spine were also generated. COMPARISON:  CT head 06/28/2017, 11/28/2016 and earlier. CT cervical spine 11/28/2016, 12/25/2014. FINDINGS: CT HEAD FINDINGS Patient motion blurred images of the skull base initially, but the images were repeated and a diagnostic study was obtained. Brain: Severe age related cortical atrophy, mild deep atrophy and mild cerebellar atrophy, unchanged. Moderate to severe changes of small vessel disease of the white matter diffusely, progressive since 2016. No mass lesion. No midline shift. No acute hemorrhage or hematoma. No extra-axial fluid collections. No evidence of acute infarction. Vascular: Moderate to severe BILATERAL carotid siphon atherosclerosis. No hyperdense vessel. Skull: No skull fractures identified. No intrinsic osseous abnormality. Visualized paranasal sinuses well aerated. Nasal bones intact. Normal-appearing sella turcica. CT Sinuses/Orbits: Prior LEFT maxillary medial antrostomy and LEFT ethmoidectomy. Visualized paranasal sinuses, bilateral mastoid air cells and bilateral middle ear cavities well-aerated. Visualized orbits and globes normal in appearance. Other: None. CT CERVICAL SPINE FINDINGS Alignment: Straightening of the usual lordosis. Anatomic POSTERIOR alignment. Skull base and vertebrae: No fractures identified involving the cervical spine. Facet joints anatomically aligned throughout with degenerative changes. Coronal reformatted images demonstrate an intact craniocervical junction, intact dens and intact lateral masses throughout. Soft tissues and spinal canal: No evidence of  paraspinous or spinal canal hematoma. No evidence of spinal stenosis. Disc levels: Severe degenerative changes at the C1-C2 articulation. Well-preserved disc spaces throughout the cervical spine with mild narrowing at C6-7, unchanged. Calcification within all of the cervical discs. Combination of facet and uncinate hypertrophy account for mild BILATERAL C3-4 and moderate LEFT C4-5 foraminal stenoses. Upper chest: Severe RIGHT and mild LEFT apical pleuroparenchymal scarring, unchanged. Visualized superior mediastinum unremarkable. Other: None. IMPRESSION: CT Head: 1. No acute intracranial abnormality. 2. Severe age related cortical atrophy, mild deep atrophy and mild cerebellar atrophy. Moderate to severe chronic microvascular ischemic changes of the white matter. CT Cervical Spine: 1. No cervical spine fractures identified. 2. Degenerative changes involving the cervical spine as detailed above. Electronically Signed   By: Evangeline Dakin M.D.   On: 02/02/2018 17:47     ASSESSMENT AND PLAN:   84 year old female patient with history of dementia, breast cancer currently under hospitalist service for hypotension, sepsis  -Sepsis secondary to UTI improving Continue IV antibiotics Follow-up cultures  -Acute UTI Resolving IV Rocephin antibiotic daily Follow-up WBC count  -Leukocytosis improving  -Hyponatremia improved with IV fluids  -Dementia Continue [facility]  All the records are reviewed and case discussed with Care Management/Social Worker. Management plans discussed with the patient, family and they are in agreement.  CODE STATUS: DNR  DVT Prophylaxis: SCDs  TOTAL TIME TAKING CARE OF THIS PATIENT: 35 minutes.   POSSIBLE D/C IN 1 to 2 DAYS, DEPENDING ON CLINICAL CONDITION.  Saundra Shelling M.D on 02/03/2018 at 12:03 PM  Between 7am to 6pm - Pager - [phone]  After 6pm go to [email or web address] - password [email or web address] [facility]  [facility]  Office   [phone]  CC: Primary care physician; Leonel Ramsay, MD  Note: This dictation was prepared with Dragon dictation along with smaller phrase technology. Any transcriptional errors that result from this process are unintentional.

## 2018-02-03 NOTE — Progress Notes (Signed)
Initial Nutrition Assessment  DOCUMENTATION CODES:   Severe malnutrition in context of social or environmental circumstances, Underweight  INTERVENTION:  Will downgrade diet to dysphagia 3 (mechanical soft) with thin liquids.  Provide Ensure Enlive po TID, each supplement provides 350 kcal and 20 grams of protein. Patient prefers vanilla or strawberry.  Provide daily MVI.  Provide 1:1 assistance at meals.  NUTRITION DIAGNOSIS:   Severe Malnutrition related to social / environmental circumstances(decreased appetite and intake related to dementia) as evidenced by severe fat depletion, severe muscle depletion.  GOAL:   Patient will meet greater than or equal to 90% of their needs  MONITOR:   PO intake, Supplement acceptance, Labs, Weight trends, I & O's  REASON FOR ASSESSMENT:   Malnutrition Screening Tool, Consult Poor PO  ASSESSMENT:   84 year old female with PMHx of dementia, breast cancer s/p left mastectomy admitted with sepsis secondary to UTI and hyponatremia.   Met with patient and her family at bedside. Patient unable to provide any history in setting of dementia. Family reports she has had a poor appetite for a while now. About 3 years ago her appetite was at baseline and she was eating well. It then began to slowly decline as dementia worsened. She now eats very small amounts at meals. She does not have any dentures and can only tolerate soft foods. Family feeding her chicken noodle soup at time of RD assessment. They report she would likely enjoy Ensure in vanilla or strawberry flavor.  Per chart patient was 56.2 kg on 01/02/2016. She is currently 45.6 kg (100.6 lbs). She has lost 10.6 kg over the past 2 years.  Medications reviewed and include: ceftriaxone.  Labs reviewed: Sodium now 138.  NUTRITION - FOCUSED PHYSICAL EXAM:    Most Recent Value  Orbital Region  Severe depletion  Upper Arm Region  Severe depletion  Thoracic and Lumbar Region  Severe depletion   Buccal Region  Severe depletion  Temple Region  Severe depletion  Clavicle Bone Region  Severe depletion  Clavicle and Acromion Bone Region  Severe depletion  Scapular Bone Region  Severe depletion  Dorsal Hand  Severe depletion  Patellar Region  Severe depletion  Anterior Thigh Region  Severe depletion  Posterior Calf Region  Severe depletion  Edema (RD Assessment)  None  Hair  Reviewed  Eyes  Unable to assess  Mouth  Unable to assess  Skin  Reviewed  Nails  Reviewed     Diet Order:   Diet Order            DIET SOFT Room service appropriate? Yes; Fluid consistency: Thin  Diet effective now              EDUCATION NEEDS:   No education needs have been identified at this time  Skin:  Skin Assessment: Reviewed RN Assessment  Last BM:  02/02/2018  Height:   Ht Readings from Last 1 Encounters:  02/02/18 '5\' 6"'  (1.676 m)    Weight:   Wt Readings from Last 1 Encounters:  02/02/18 45.6 kg    Ideal Body Weight:  59.1 kg  BMI:  Body mass index is 16.24 kg/m.  Estimated Nutritional Needs:   Kcal:  1200-1400  Protein:  60-70 grams  Fluid:  1.2-1.4 L/day  Willey Blade, MS, RD, LDN Office: [phone] Pager: [phone] After Hours/Weekend Pager: [phone]

## 2018-02-03 NOTE — Plan of Care (Signed)
  Problem: Clinical Measurements: Goal: Diagnostic test results will improve Outcome: Progressing   Problem: Clinical Measurements: Goal: Ability to maintain clinical measurements within normal limits will improve Outcome: Progressing Goal: Diagnostic test results will improve Outcome: Progressing   Problem: Nutrition: Goal: Adequate nutrition will be maintained Outcome: Progressing

## 2018-02-03 NOTE — Progress Notes (Signed)
Physical Therapy Evaluation Patient Details Name: Beth Randall MRN: [id number] DOB: Sep 29, 1923 Today's Date: 02/03/2018   History of Present Illness  Beth Randall  is a 84 y.o. female with dementia.  Patient unable to give me any history.  As per family last night had some shaking episodes.  She woke up this morning and needed help to even walk and was put in a wheelchair.   Clinical Impression  Patient presents with decreased mobility and strength of BLE. Family reports that she was able to walk short distance with assist prior to yesterday. She needs max assist for bed mobility and transfers sit to stand with RW and max assist and is not able to stand all the way straight to support herself. She will benefit from mobility training and balance training to return to prior level of function and improve her quality of life.     Follow Up Recommendations SNF    Equipment Recommendations  Rolling walker with 5" wheels    Recommendations for Other Services       Precautions / Restrictions Precautions Precautions: None Restrictions Weight Bearing Restrictions: No      Mobility  Bed Mobility Overal bed mobility: Needs Assistance             General bed mobility comments: (max assist supien to sit and sit to supien)  Transfers Overall transfer level: Needs assistance Equipment used: Rolling walker (2 wheeled) Transfers: Sit to/from Stand Sit to Stand: Max assist         General transfer comment: (not able to come up all the way to full stand)  Ambulation/Gait Ambulation/Gait assistance: (unable)              Stairs            Wheelchair Mobility    Modified Rankin (Stroke Patients Only)       Balance Overall balance assessment: Needs assistance Sitting-balance support: Feet supported Sitting balance-Leahy Scale: Poor Sitting balance - Comments: (posterior lean) Postural control: Posterior lean                                    Pertinent Vitals/Pain Pain Assessment: No/denies pain    Home Living Family/patient expects to be discharged to:: Private residence Living Arrangements: Children Available Help at Discharge: Family Type of Home: House Home Access: Stairs to enter Entrance Stairs-Rails: Right Entrance Stairs-Number of Steps: 8 Home Layout: One level        Prior Function Level of Independence: Needs assistance   Gait / Transfers Assistance Needed: mod assist for ambulation  ADL's / Homemaking Assistance Needed: (total care but did walk with help to use the BR)        Hand Dominance        Extremity/Trunk Assessment   Upper Extremity Assessment Upper Extremity Assessment: Overall WFL for tasks assessed    Lower Extremity Assessment Lower Extremity Assessment: Generalized weakness       Communication   Communication: Other (comment)  Cognition Arousal/Alertness: Awake/alert Behavior During Therapy: Flat affect Overall Cognitive Status: History of cognitive impairments - at baseline                                        General Comments      Exercises     Assessment/Plan    PT  Assessment Patient needs continued PT services  PT Problem List Decreased strength;Decreased activity tolerance;Decreased balance;Decreased mobility       PT Treatment Interventions Functional mobility training;Balance training    PT Goals (Current goals can be found in the Care Plan section)  Acute Rehab PT Goals Patient Stated Goal: (no goals stated) PT Goal Formulation: Patient unable to participate in goal setting Potential to Achieve Goals: Fair    Frequency Min 2X/week   Barriers to discharge Other (comment) dementia    Co-evaluation               AM-PAC PT "6 Clicks" Daily Activity  Outcome Measure Difficulty turning over in bed (including adjusting bedclothes, sheets and blankets)?: Unable Difficulty moving from lying on back to sitting on the side  of the bed? : Unable Difficulty sitting down on and standing up from a chair with arms (e.g., wheelchair, bedside commode, etc,.)?: Unable Help needed moving to and from a bed to chair (including a wheelchair)?: Total Help needed walking in hospital room?: Total Help needed climbing 3-5 steps with a railing? : Total 6 Click Score: 6    End of Session Equipment Utilized During Treatment: Gait belt Activity Tolerance: Patient limited by fatigue Patient left: in bed;with bed alarm set   PT Visit Diagnosis: Unsteadiness on feet (R26.81);Muscle weakness (generalized) (M62.81)    Time: 1210-1230 PT Time Calculation (min) (ACUTE ONLY): 20 min   Charges:   PT Evaluation $PT Eval Low Complexity: 1 Low            Lucas, Sherryl Barters, PT DPT 02/03/2018, 4:14 PM

## 2018-02-04 DIAGNOSIS — E43 Unspecified severe protein-calorie malnutrition: Secondary | ICD-10-CM

## 2018-02-04 LAB — CBC
HCT: 28.7 % — ABNORMAL LOW (ref 35.0–47.0)
HEMOGLOBIN: 10.2 g/dL — AB (ref 12.0–16.0)
MCH: 31.4 pg (ref 26.0–34.0)
MCHC: 35.5 g/dL (ref 32.0–36.0)
MCV: 88.5 fL (ref 80.0–100.0)
PLATELETS: 254 10*3/uL (ref 150–440)
RBC: 3.24 MIL/uL — AB (ref 3.80–5.20)
RDW: 13 % (ref 11.5–14.5)
WBC: 10.9 10*3/uL (ref 3.6–11.0)

## 2018-02-04 MED ORDER — CEPHALEXIN 500 MG PO CAPS: 500.0000 mg | ORAL_CAPSULE | Freq: Two times a day (BID) | ORAL | 0 refills | Status: AC

## 2018-02-04 NOTE — Discharge Summary (Addendum)
[facility] [facility] [place] NAME: Beth Randall    MR#:  [id number]  DATE OF BIRTH:  Jul 02, 1923  DATE OF ADMISSION:  02/02/2018   ADMITTING PHYSICIAN: Loletha Grayer, MD  DATE OF DISCHARGE: 02/04/18  PRIMARY CARE PHYSICIAN: Leonel Ramsay, MD   ADMISSION DIAGNOSIS:  Encephalopathy acute [G93.40] Sepsis due to urinary tract infection ([place]) [A41.9, N39.0] DISCHARGE DIAGNOSIS:  Active Problems:   Sepsis ([place])   Protein-calorie malnutrition, severe  SECONDARY DIAGNOSIS:   Past Medical History:  Diagnosis Date  . Breast cancer ([place])   . Dementia    HOSPITAL COURSE:   Beth Randall is a 84 year old female with a PMH of breast cancer and dementia who presented to the ED with weakness, shakiness, and sweatiness. She was noted to have hypotension and a UTI. She was admitted for further management.  Acute Klebsiella UTI - urine cultures grew >100,000 CFU klebsiella - blood cultures negative - patient initially treated with ceftriaxone and then switched to keflex on discharge for a total 7 day course  Hypotension- initial BPs in the 90s/50s - resolved with IVFs  Leukocytosis- initial WBC count was 16.1, improved to 10.9 on the day of discharge  Severe malnutrition - seen by dietician who recommended dysphagia III diet and ensure tid  Generalized weakness - seen by PT, who recommended SNF, but daughter and patient preferred discharge home with home health  Dementia- stable - continued namenda   DISCHARGE CONDITIONS:  Klebsiella UTI Severe malnutrition History of breast cancer Generalized weakness CONSULTS OBTAINED:  None  DRUG ALLERGIES:   Allergies  Allergen Reactions  . Ibuprofen Rash    Tongue swelling  . Aspirin   . Penicillins     .Has patient had a PCN reaction causing immediate rash, facial/tongue/throat swelling, SOB or lightheadedness with hypotension: Unknown Has patient had a PCN reaction causing severe rash  involving mucus membranes or skin necrosis: Unknown Has patient had a PCN reaction that required hospitalization: Unknown Has patient had a PCN reaction occurring within the last 10 years: Unknown If all of the above answers are "NO", then may proceed with Cephalosporin use.    DISCHARGE MEDICATIONS:   Allergies as of 02/04/2018      Reactions   Ibuprofen Rash   Tongue swelling   Aspirin    Penicillins    .Has patient had a PCN reaction causing immediate rash, facial/tongue/throat swelling, SOB or lightheadedness with hypotension: Unknown Has patient had a PCN reaction causing severe rash involving mucus membranes or skin necrosis: Unknown Has patient had a PCN reaction that required hospitalization: Unknown Has patient had a PCN reaction occurring within the last 10 years: Unknown If all of the above answers are "NO", then may proceed with Cephalosporin use.      Medication List    TAKE these medications   cephALEXin 500 MG capsule Commonly known as:  KEFLEX Take 1 capsule (500 mg total) by mouth 2 (two) times daily for 5 days.   memantine 10 MG tablet Commonly known as:  NAMENDA Take 1 tablet by mouth 1 day or 1 dose.   sertraline 50 MG tablet Commonly known as:  ZOLOFT Take 75 mg by mouth daily.   traZODone 50 MG tablet Commonly known as:  DESYREL Take 50-100 mg by mouth as directed.        DISCHARGE INSTRUCTIONS:  1. F/u with PCP in 1-2 weeks 2. Discharged home on keflex 3. Urine culture susceptibilities were pending on  the day of discharge- please follow-up with this. DIET:  Dysphagia III Ensure tid DISCHARGE CONDITION:  Stable ACTIVITY:  Activity as tolerated OXYGEN:  Home Oxygen: No.  Oxygen Delivery: room air DISCHARGE LOCATION:  home   If you experience worsening of your admission symptoms, develop shortness of breath, life threatening emergency, suicidal or homicidal thoughts you must seek medical attention immediately by calling 911 or calling  your MD immediately  if symptoms less severe.  You Must read complete instructions/literature along with all the possible adverse reactions/side effects for all the Medicines you take and that have been prescribed to you. Take any new Medicines after you have completely understood and accpet all the possible adverse reactions/side effects.   Please note  You were cared for by a hospitalist during your hospital stay. If you have any questions about your discharge medications or the care you received while you were in the hospital after you are discharged, you can call the unit and asked to speak with the hospitalist on call if the hospitalist that took care of you is not available. Once you are discharged, your primary care physician will handle any further medical issues. Please note that NO REFILLS for any discharge medications will be authorized once you are discharged, as it is imperative that you return to your primary care physician (or establish a relationship with a primary care physician if you do not have one) for your aftercare needs so that they can reassess your need for medications and monitor your lab values.    On the day of Discharge:  VITAL SIGNS:  Blood pressure (!) 115/57, pulse 80, temperature 98.6 F (37 C), temperature source Oral, resp. rate 16, height 5\' 6"  (1.676 m), weight 45.6 kg, SpO2 93 %. PHYSICAL EXAMINATION:  GENERAL:  84 y.o.-year-old patient lying in the bed with no acute distress.  EYES: Pupils equal, round, reactive to light and accommodation. No scleral icterus. Extraocular muscles intact.  HEENT: Head atraumatic, normocephalic. Oropharynx and nasopharynx clear.  NECK:  Supple, no jugular venous distention. No thyroid enlargement, no tenderness.  LUNGS: Normal breath sounds bilaterally, no wheezing, rales,rhonchi or crepitation. No use of accessory muscles of respiration.  CARDIOVASCULAR: S1, S2 normal. No murmurs, rubs, or gallops.  ABDOMEN: Soft,  non-tender, non-distended. Bowel sounds present. No organomegaly or mass.  EXTREMITIES: No pedal edema, cyanosis, or clubbing.  NEUROLOGIC: Cranial nerves II through XII are intact. Muscle strength 5/5 in all extremities. Sensation intact. Gait not checked.  PSYCHIATRIC: The patient is alert and oriented x 3.  SKIN: No obvious rash, lesion, or ulcer.  DATA REVIEW:   CBC Recent Labs  Lab 02/04/18 0342  WBC 10.9  HGB 10.2*  HCT 28.7*  PLT 254    Chemistries  Recent Labs  Lab 02/03/18 0417  NA 138  K 3.8  CL 105  CO2 25  GLUCOSE 115*  BUN 16  CREATININE 0.67  CALCIUM 8.0*     Microbiology Results  Results for orders placed or performed during the hospital encounter of 02/02/18  Urine Culture     Status: Abnormal (Preliminary result)   Collection Time: 02/02/18  4:30 PM  Result Value Ref Range Status   Specimen Description   Final    URINE, RANDOM Performed at [facility], [address] [place], [place] [address]    Special Requests   Final    NONE Performed at [facility], [address], [place], [place] [address]    Culture >=100,000 COLONIES/mL  KLEBSIELLA PNEUMONIAE (A)  Final   Report Status PENDING  Incomplete  Blood culture (routine x 2)     Status: None (Preliminary result)   Collection Time: 02/02/18  6:21 PM  Result Value Ref Range Status   Specimen Description BLOOD R FOREARM  Final   Special Requests   Final    BOTTLES DRAWN AEROBIC AND ANAEROBIC Blood Culture results may not be optimal due to an excessive volume of blood received in culture bottles   Culture   Final    NO GROWTH 2 DAYS Performed at [facility], [address], [place], [place] [address]    Report Status PENDING  Incomplete  Blood culture (routine x 2)     Status: None (Preliminary result)   Collection Time: 02/02/18  6:21 PM  Result Value Ref Range Status   Specimen Description BLOOD R FA  Final   Special Requests   Final    BOTTLES DRAWN  AEROBIC AND ANAEROBIC Blood Culture adequate volume   Culture   Final    NO GROWTH 2 DAYS Performed at [facility], [address], [place], [place] [address]    Report Status PENDING  Incomplete    RADIOLOGY:  No results found.   Management plans discussed with the patient, family and they are in agreement.  CODE STATUS: DNR   TOTAL TIME TAKING CARE OF THIS PATIENT: 40 minutes.    Berna Spare Braxston Quinter M.D on 02/04/2018 at 4:15 PM  Between 7am to 6pm - Pager - [phone]  After 6pm go to [email or web address] - Proofreader  [employer]  Hospitalists  Office  [phone]  CC: Primary care physician; Leonel Ramsay, MD   Note: This dictation was prepared with Dragon dictation along with smaller phrase technology. Any transcriptional errors that result from this process are unintentional.

## 2018-02-04 NOTE — Clinical Social Work Note (Signed)
CSW received referral for SNF.  Case discussed with case manager and plan is to discharge home with home health, patient's daughter in law is main caregiver and will continue to take care of her.  CSW to sign off please re-consult if social work needs arise.  Jones Broom. [place], MSW, [place]

## 2018-02-04 NOTE — Care Management Note (Signed)
Case Management Note  Patient Details  Name: TINEY ZIPPER MRN: [id number] Date of Birth: [birth year]  Subjective/Objective:     PT recommended SNF; family decision is to discharge to home with home health, [facility], for OT and PT.  Daughter in law cares for patient in the home.  States she is able to walk but is unstable when turning.  Patient has walker at home and does not like to use it.      No Agency preference.  Referal called to [facility].             Action/Plan: Plan to discharge today with home health.   Expected Discharge Date:  02/04/18               Expected Discharge Plan:  [place]  In-House Referral:  Clinical Social Work  Discharge planning Services  CM Consult  Post Acute Care Choice:  Home Health Choice offered to:     DME Arranged:    DME Agency:     HH Arranged:  OT, PT HH Agency:  [place]  Status of Service:  Completed, signed off  If discussed at [place] of Stay Meetings, dates discussed:    Additional Comments:  Elza Rafter, RN 02/04/2018, 4:13 PM

## 2018-02-04 NOTE — Plan of Care (Signed)
  Problem: Fluid Volume: Goal: Hemodynamic stability will improve Outcome: Progressing   Problem: Clinical Measurements: Goal: Diagnostic test results will improve Outcome: Progressing Goal: Signs and symptoms of infection will decrease Outcome: Progressing   Problem: Respiratory: Goal: Ability to maintain adequate ventilation will improve Outcome: Progressing   Problem: Education: Goal: Knowledge of General Education information will improve Description Including pain rating scale, medication(s)/side effects and non-pharmacologic comfort measures Outcome: Progressing   Problem: Health Behavior/Discharge Planning: Goal: Ability to manage health-related needs will improve Outcome: Progressing   Problem: Clinical Measurements: Goal: Ability to maintain clinical measurements within normal limits will improve Outcome: Progressing Goal: Will remain free from infection Outcome: Progressing Goal: Diagnostic test results will improve Outcome: Progressing Goal: Cardiovascular complication will be avoided Outcome: Progressing   Problem: Activity: Goal: Risk for activity intolerance will decrease Outcome: Progressing   Problem: Nutrition: Goal: Adequate nutrition will be maintained Outcome: Progressing   Problem: Elimination: Goal: Will not experience complications related to bowel motility Outcome: Progressing Goal: Will not experience complications related to urinary retention Outcome: Progressing   Problem: Pain Managment: Goal: General experience of comfort will improve Outcome: Progressing   Problem: Safety: Goal: Ability to remain free from injury will improve Outcome: Progressing   Problem: Skin Integrity: Goal: Risk for impaired skin integrity will decrease Outcome: Progressing

## 2018-02-04 NOTE — NC FL2 (Addendum)
[place] LEVEL OF CARE SCREENING TOOL     IDENTIFICATION  Patient Name: Beth Randall Birthdate: 01-Sep-1923 Sex: female Admission Date (Current Location): 02/02/2018  [place] and [place] Number:  Engineering geologist and Address:  [facility], [address], [place], [place] [address]      Provider Number: [id number]  Attending Physician Name and Address:  Sela Hua, MD  Relative Name and Phone Number:       Current Level of Care: Hospital Recommended Level of Care: [place] Prior Approval Number:    Date Approved/Denied:   PASRR Number:  [id number]  Discharge Plan: SNF    Current Diagnoses: Patient Active Problem List   Diagnosis Date Noted  . Protein-calorie malnutrition, severe 02/04/2018  . Sepsis ([place]) 02/02/2018  . Ganglion cyst 07/02/2017  . Epidermoid cyst 07/02/2017  . 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 Onychomycosis 05/03/2017  . Acute pain of left knee 04/15/2017  . History of depression 03/29/2017  . Sleep disturbance 06/28/2016  . Poor balance 12/21/2015  . Gait disturbance 12/21/2015  . Dementia with behavioral disturbance 12/21/2015  . At high risk for falls 12/21/2015  . Anxiety, generalized 12/21/2015  . SSS (sick sinus syndrome) ([place]) 03/19/2014  . Late onset Alzheimer's disease with behavioral disturbance 06/10/2013  . Insomnia 06/10/2013    Orientation RESPIRATION BLADDER Height & Weight     Self  Normal Incontinent Weight: 100 lb 9.6 oz (45.6 kg) Height:  5\' 6"  (167.6 cm)  BEHAVIORAL SYMPTOMS/MOOD NEUROLOGICAL BOWEL NUTRITION STATUS      Continent Diet(Diet: DYS 3 )  AMBULATORY STATUS COMMUNICATION OF NEEDS Skin   Extensive Assist Verbally Normal                       Personal Care Assistance Level of Assistance  Bathing, Feeding, Dressing Bathing Assistance: Limited assistance Feeding assistance: Independent Dressing Assistance: Limited assistance      Functional Limitations Info  Sight, Hearing, Speech Sight Info: Adequate Hearing Info: Adequate Speech Info: Adequate    SPECIAL CARE FACTORS FREQUENCY  PT (By licensed PT), OT (By licensed OT)     PT Frequency: (5) OT Frequency: (5)            Contractures      Additional Factors Info  Code Status, Allergies Code Status Info: (DNR ) Allergies Info: (Ibuprofen, Aspirin, Penicillins)           Current Medications (02/04/2018):  This is the current hospital active medication list Current Facility-Administered Medications  Medication Dose Route Frequency Provider Last Rate Last Dose  . acetaminophen (TYLENOL) tablet 650 mg  650 mg Oral Q6H PRN Loletha Grayer, MD       Or  . acetaminophen (TYLENOL) suppository 650 mg  650 mg Rectal Q6H PRN Wieting, Richard, MD      . cefTRIAXone (ROCEPHIN) 1 g in sodium chloride 0.9 % 100 mL IVPB  1 g Intravenous Q24H Loletha Grayer, MD   Stopped at 02/03/18 1809  . enoxaparin (LOVENOX) injection 30 mg  30 mg Subcutaneous Q24H Loletha Grayer, MD   30 mg at 02/03/18 2211  . feeding supplement (ENSURE ENLIVE) (ENSURE ENLIVE) liquid 237 mL  237 mL Oral TID BM Pyreddy, Pavan, MD      . lactated ringers infusion   Intravenous Continuous Loletha Grayer, MD 40 mL/hr at 02/04/18 5284    . memantine (NAMENDA) tablet 10 mg  10 mg Oral Daily Wieting, Richard,  MD   10 mg at 02/03/18 5361  . multivitamin with minerals tablet 1 tablet  1 tablet Oral Daily Pyreddy, Pavan, MD      . sertraline (ZOLOFT) tablet 75 mg  75 mg Oral Daily Loletha Grayer, MD   75 mg at 02/03/18 0925  . sodium chloride flush (NS) 0.9 % injection 3 mL  3 mL Intravenous Q12H Loletha Grayer, MD   3 mL at 02/03/18 0123  . traZODone (DESYREL) tablet 50 mg  50 mg Oral QHS PRN Loletha Grayer, MD         Discharge Medications: Please see discharge summary for a list of discharge medications.  Relevant Imaging Results:  Relevant Lab Results:   Additional  Information (SSN: [id number])  Tiffiny Worthy, Veronia Beets, LCSW

## 2018-02-04 NOTE — Discharge Instructions (Signed)
It was so nice to meet you during this hospitalization!  We found that you had a urinary tract infection. We treated you with IV antibiotics while you were here. I have prescribed an antibiotic called Keflex, which you should take twice a day for the next 5 days at home.  Please make sure you follow-up with your primary care doctor in the next 1-2 weeks.  -Dr. Brett Albino

## 2018-02-05 LAB — URINE CULTURE

## 2018-02-07 LAB — CULTURE, BLOOD (ROUTINE X 2)
CULTURE: NO GROWTH
Culture: NO GROWTH
Special Requests: ADEQUATE

## 2018-04-28 ENCOUNTER — Encounter: Payer: Self-pay | Admitting: *Deleted

## 2018-04-28 ENCOUNTER — Emergency Department
Admission: EM | Admit: 2018-04-28 | Discharge: 2018-04-28 | Disposition: A | Discharge status: Home / Self Care | Payer: Medicare Other | Attending: Emergency Medicine | Admitting: Emergency Medicine

## 2018-04-28 ENCOUNTER — Emergency Department: Payer: Medicare Other

## 2018-04-28 ENCOUNTER — Other Ambulatory Visit: Payer: Self-pay

## 2018-04-28 DIAGNOSIS — R0982 Postnasal drip: Secondary | ICD-10-CM | POA: Diagnosis not present

## 2018-04-28 DIAGNOSIS — Z79899 Other long term (current) drug therapy: Secondary | ICD-10-CM | POA: Diagnosis not present

## 2018-04-28 DIAGNOSIS — F0390 Unspecified dementia without behavioral disturbance: Secondary | ICD-10-CM | POA: Insufficient documentation

## 2018-04-28 DIAGNOSIS — R0602 Shortness of breath: Secondary | ICD-10-CM

## 2018-04-28 LAB — CBC
HEMATOCRIT: 42.3 % (ref 36.0–46.0)
HEMOGLOBIN: 13.1 g/dL (ref 12.0–15.0)
MCH: 29 pg (ref 26.0–34.0)
MCHC: 31 g/dL (ref 30.0–36.0)
MCV: 93.8 fL (ref 80.0–100.0)
Platelets: 263 10*3/uL (ref 150–400)
RBC: 4.51 MIL/uL (ref 3.87–5.11)
RDW: 13.8 % (ref 11.5–15.5)
WBC: 11 10*3/uL — ABNORMAL HIGH (ref 4.0–10.5)
nRBC: 0 % (ref 0.0–0.2)

## 2018-04-28 LAB — BASIC METABOLIC PANEL
ANION GAP: 10 (ref 5–15)
BUN: 13 mg/dL (ref 8–23)
CHLORIDE: 102 mmol/L (ref 98–111)
CO2: 30 mmol/L (ref 22–32)
Calcium: 9.5 mg/dL (ref 8.9–10.3)
Creatinine, Ser: 0.73 mg/dL (ref 0.44–1.00)
GFR calc Af Amer: >60 mL/min (ref >60)
GLUCOSE: 93 mg/dL (ref 70–99)
POTASSIUM: 4.3 mmol/L (ref 3.5–5.1)
Sodium: 142 mmol/L (ref 135–145)

## 2018-04-28 LAB — TROPONIN I

## 2018-04-28 MED ORDER — IPRATROPIUM-ALBUTEROL 0.5-2.5 (3) MG/3ML IN SOLN
1.5000 mL | Freq: Once | RESPIRATORY_TRACT | Status: AC
  Administered 2018-04-28: 1.5 mL via RESPIRATORY_TRACT
  Filled 2018-04-28: qty 3

## 2018-04-28 MED ORDER — LORATADINE 10 MG PO TABS: 10.0000 mg | ORAL_TABLET | Freq: Every day | ORAL | 2 refills | Status: DC

## 2018-04-28 NOTE — ED Provider Notes (Signed)
[facility] Emergency Department Provider Note   ____________________________________________    I have reviewed the triage vital signs and the nursing notes.   HISTORY  Chief Complaint Shortness of Breath   Patient with a history of dementia, history as per caretaker  HPI Beth Randall is a 84 y.o. female brought in for evaluation of shortness of breath.  The patient has dementia which limits history.  Per caretaker the patient suddenly started coughing and seemed to have difficulty breathing.  This event lasted approximately 1 minute and then the patient improved significantly.  Since then she has been doing well with no difficulties breathing.  No reports of fever.  No reports of cough calf swelling.  Family does note significant nasal drip at times and congestion   Past Medical History:  Diagnosis Date  . Breast cancer ([place])   . Dementia [facility])     Patient Active Problem List   Diagnosis Date Noted  . Protein-calorie malnutrition, severe 02/04/2018  . Sepsis ([place]) 02/02/2018  . Ganglion cyst 07/02/2017  . Epidermoid cyst 07/02/2017  . 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 Onychomycosis 05/03/2017  . Acute pain of left knee 04/15/2017  . History of depression 03/29/2017  . Sleep disturbance 06/28/2016  . Poor balance 12/21/2015  . Gait disturbance 12/21/2015  . Dementia with behavioral disturbance ([place]) 12/21/2015  . At high risk for falls 12/21/2015  . Anxiety, generalized 12/21/2015  . SSS (sick sinus syndrome) ([place]) 03/19/2014  . Late onset Alzheimer's disease with behavioral disturbance ([place]) 06/10/2013  . Insomnia 06/10/2013    Past Surgical History:  Procedure Laterality Date  . BACK SURGERY    . MASTECTOMY Left     Prior to Admission medications   Medication Sig Start Date End Date Taking? Authorizing Provider  loratadine (CLARITIN) 10 MG tablet Take 1 tablet (10 mg total) by mouth daily. 04/28/18 04/28/19  Lavonia Drafts, MD  memantine (NAMENDA) 10 MG tablet Take 1 tablet by mouth 1 day or 1 dose.    [provider]  sertraline (ZOLOFT) 50 MG tablet Take 75 mg by mouth daily. 11/18/16   [provider]  traZODone (DESYREL) 50 MG tablet Take 50-100 mg by mouth as directed. 11/17/16   [provider]     Allergies Ibuprofen; Aspirin; and Penicillins  Family History  Problem Relation Age of Onset  . Hypertension Mother     Social History Social History   Tobacco Use  . Smoking status: Never Smoker  . Smokeless tobacco: Never Used  Substance Use Topics  . Alcohol use: No  . Drug use: No    Review of Systems  Constitutional: no reported fever Eyes: No discharge ENT: As above Cardiovascular: Denies chest pain. Respiratory: As above Gastrointestinal: No vomiting Genitourinary: No foul-smelling urine Musculoskeletal: No leg swelling Skin: Negative for rash. Neurological: Negative for weakness   ____________________________________________   PHYSICAL EXAM:  VITAL SIGNS: ED Triage Vitals  Enc Vitals Group     BP 04/28/18 1704 132/78     Pulse Rate 04/28/18 1704 (!) 121     Resp --      Temp 04/28/18 1704 98.9 F (37.2 C)     Temp Source 04/28/18 1704 Oral     SpO2 04/28/18 1704 98 %     Weight 04/28/18 1614 51.7 kg (114 lb)     Height 04/28/18 1614 1.651 m (5\' 5" )     Head Circumference --  Peak Flow --      Pain Score 04/28/18 1903 0     Pain Loc --      Pain Edu? --      Excl. in [place]? --     Constitutional: Alert, no acute distress Eyes: Conjunctivae are normal.   Nose: No congestion/rhinnorhea. Mouth/Throat: Mucous membranes are moist.  Axis normal, no intraoral swelling Neck:  Painless ROM, thyroid is normal size Cardiovascular: Normal rate, regular rhythm. Grossly normal heart sounds.  Good peripheral circulation. Respiratory: Normal respiratory effort.  No retractions. Lungs CTAB. Gastrointestinal: Soft and nontender. No  distention.  Musculoskeletal: No lower extremity tenderness nor edema.  Warm and well perfused Neurologic: . No gross focal neurologic deficits are appreciated.  Skin:  Skin is warm, dry and intact. No rash noted.   ____________________________________________   LABS (all labs ordered are listed, but only abnormal results are displayed)  Labs Reviewed  CBC - Abnormal; Notable for the following components:      Result Value   WBC 11.0 (*)    All other components within normal limits  BASIC METABOLIC PANEL  TROPONIN I   ____________________________________________  EKG  ED ECG REPORT I, Lavonia Drafts, the attending physician, personally viewed and interpreted this ECG.  Date: 04/28/2018  Rhythm: normal sinus rhythm QRS Axis: normal Intervals: 1st Degree AV block ST/T Wave abnormalities: normal   ____________________________________________  RADIOLOGY  Chest x-ray unremarkable ____________________________________________   PROCEDURES  Procedure(s) performed: No  Procedures   Critical Care performed: No ____________________________________________   INITIAL IMPRESSION / ASSESSMENT AND PLAN / ED COURSE  Pertinent labs & imaging results that were available during my care of the patient were reviewed by me and considered in my medical decision making (see chart for details).  Patient well-appearing in no acute distress.  Her breathing is quite normal here in the emergency department.  Respiratory rate is 16 on my exam.  Clear to auscultation.  No pharyngeal swelling or intraoral swelling.  I suspect the patient is having postnasal drip which may have led to her coughing spell.  No evidence of pneumonia.  Labs unremarkable, will start her on medication for her postnasal drip outpatient follow-up as needed    ____________________________________________   FINAL CLINICAL IMPRESSION(S) / ED DIAGNOSES  Final diagnoses:  Post-nasal drip  Shortness of breath         Note:  This document was prepared using Dragon voice recognition software and may include unintentional dictation errors.    Lavonia Drafts, MD 04/28/18 2043

## 2018-04-28 NOTE — ED Triage Notes (Signed)
Pt to triage via wheelchair.  Pt has sob for 1 day.  Pt reports congestion in throat and chest.   No chest pain.  Pt saw doctor last week and was started on tessalon.  Family states she isn't any better.  Pt alert

## 2019-08-28 IMAGING — CR DG RIBS W/ CHEST 3+V*R*
3 series · 3 of 3 positions shown · non-contrast
Comparison: Chest two-view 09/24/2009

CLINICAL DATA: Fall.  Right chest pain.  History of breast cancer

EXAM:
RIGHT RIBS AND CHEST - 3+ VIEW

[chest pa]
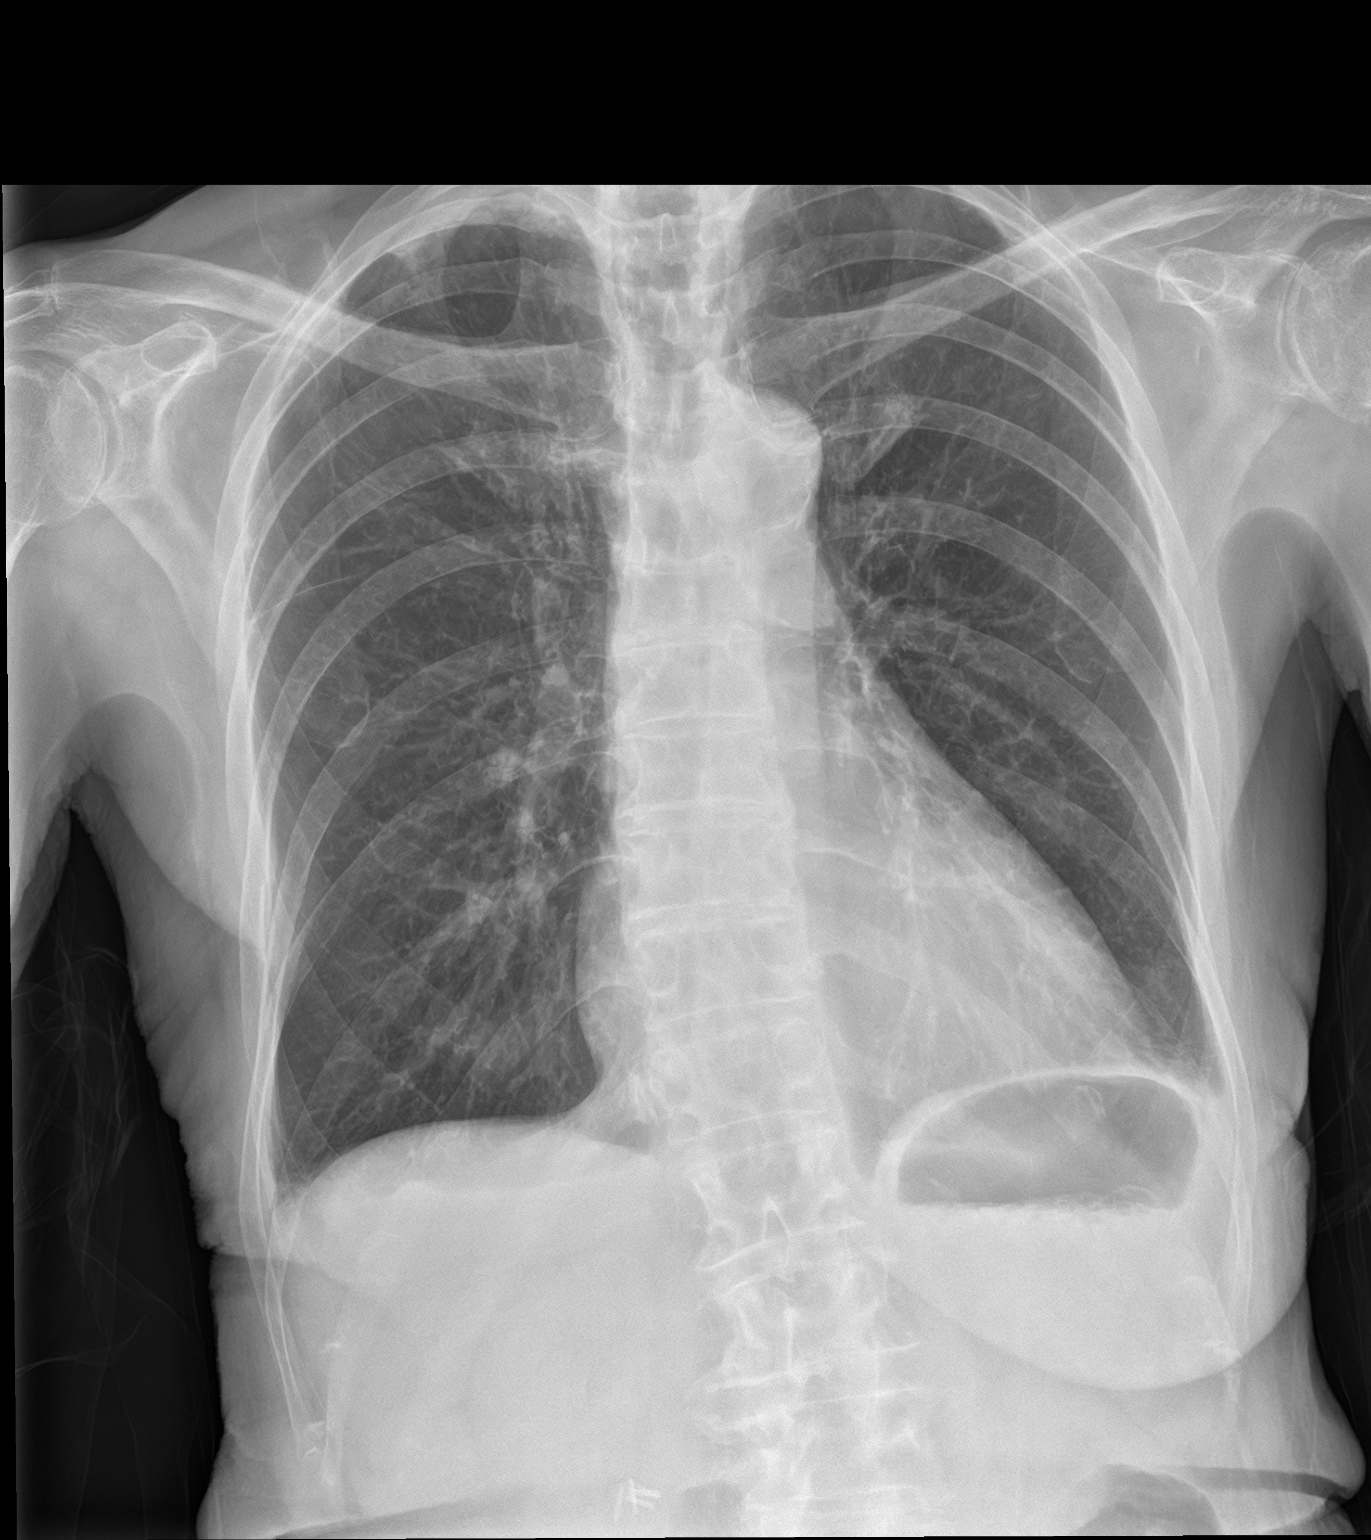

[rib ap]
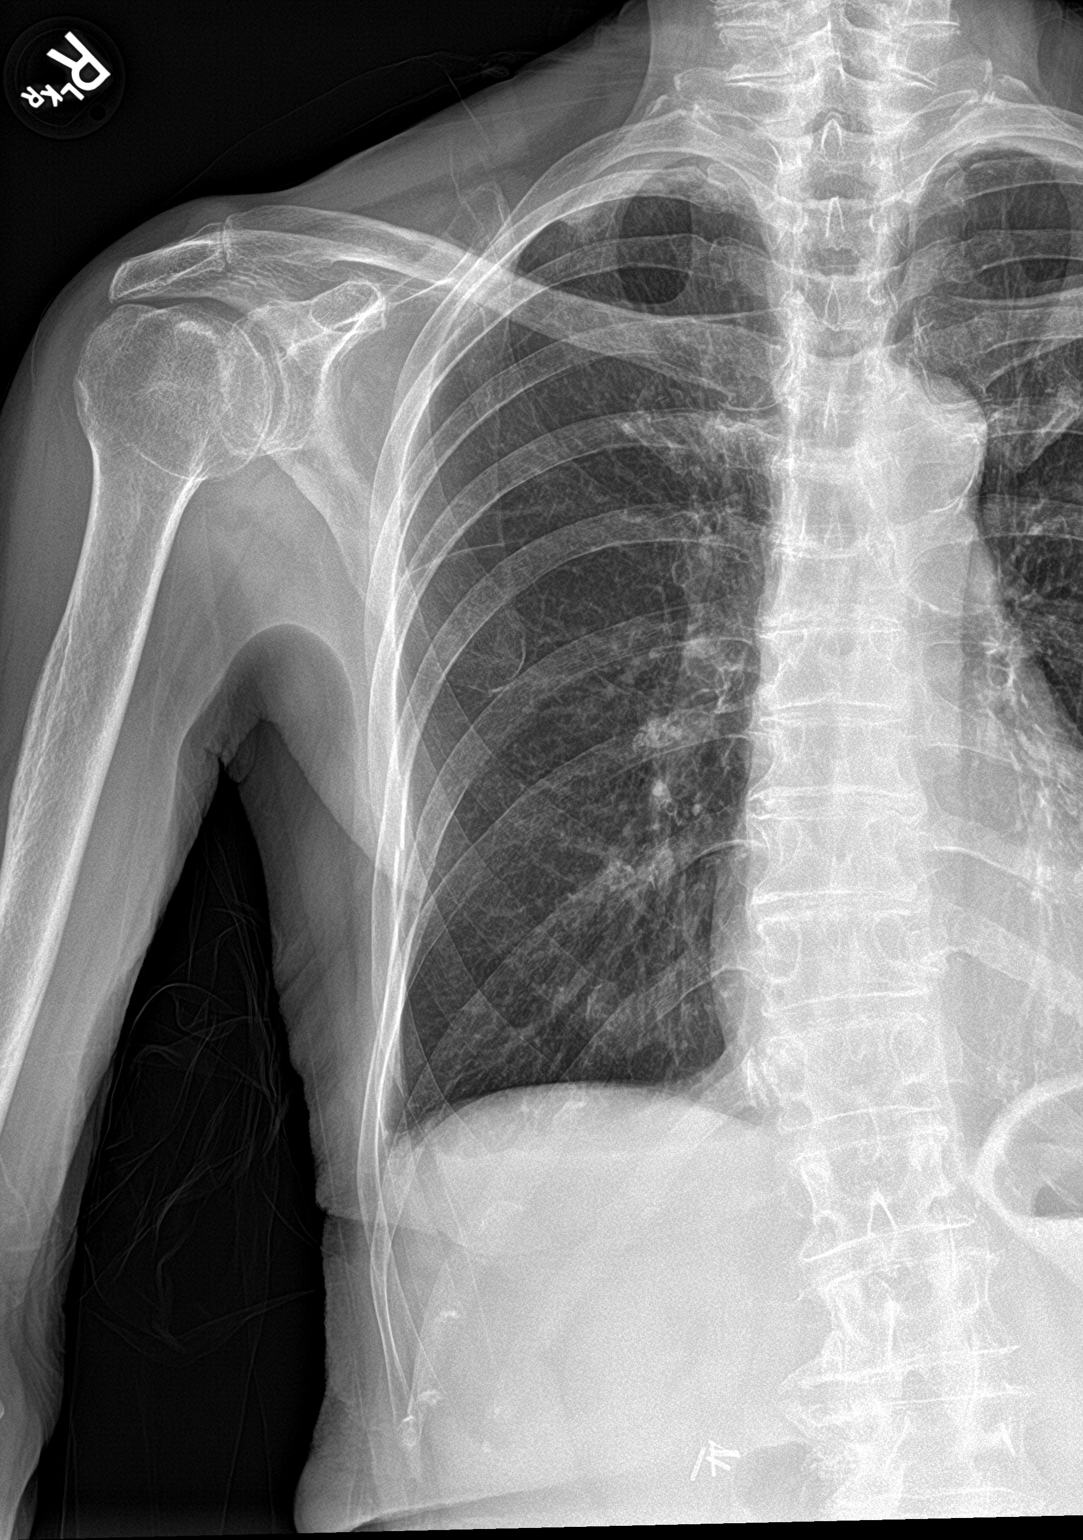

[rib ap obl]
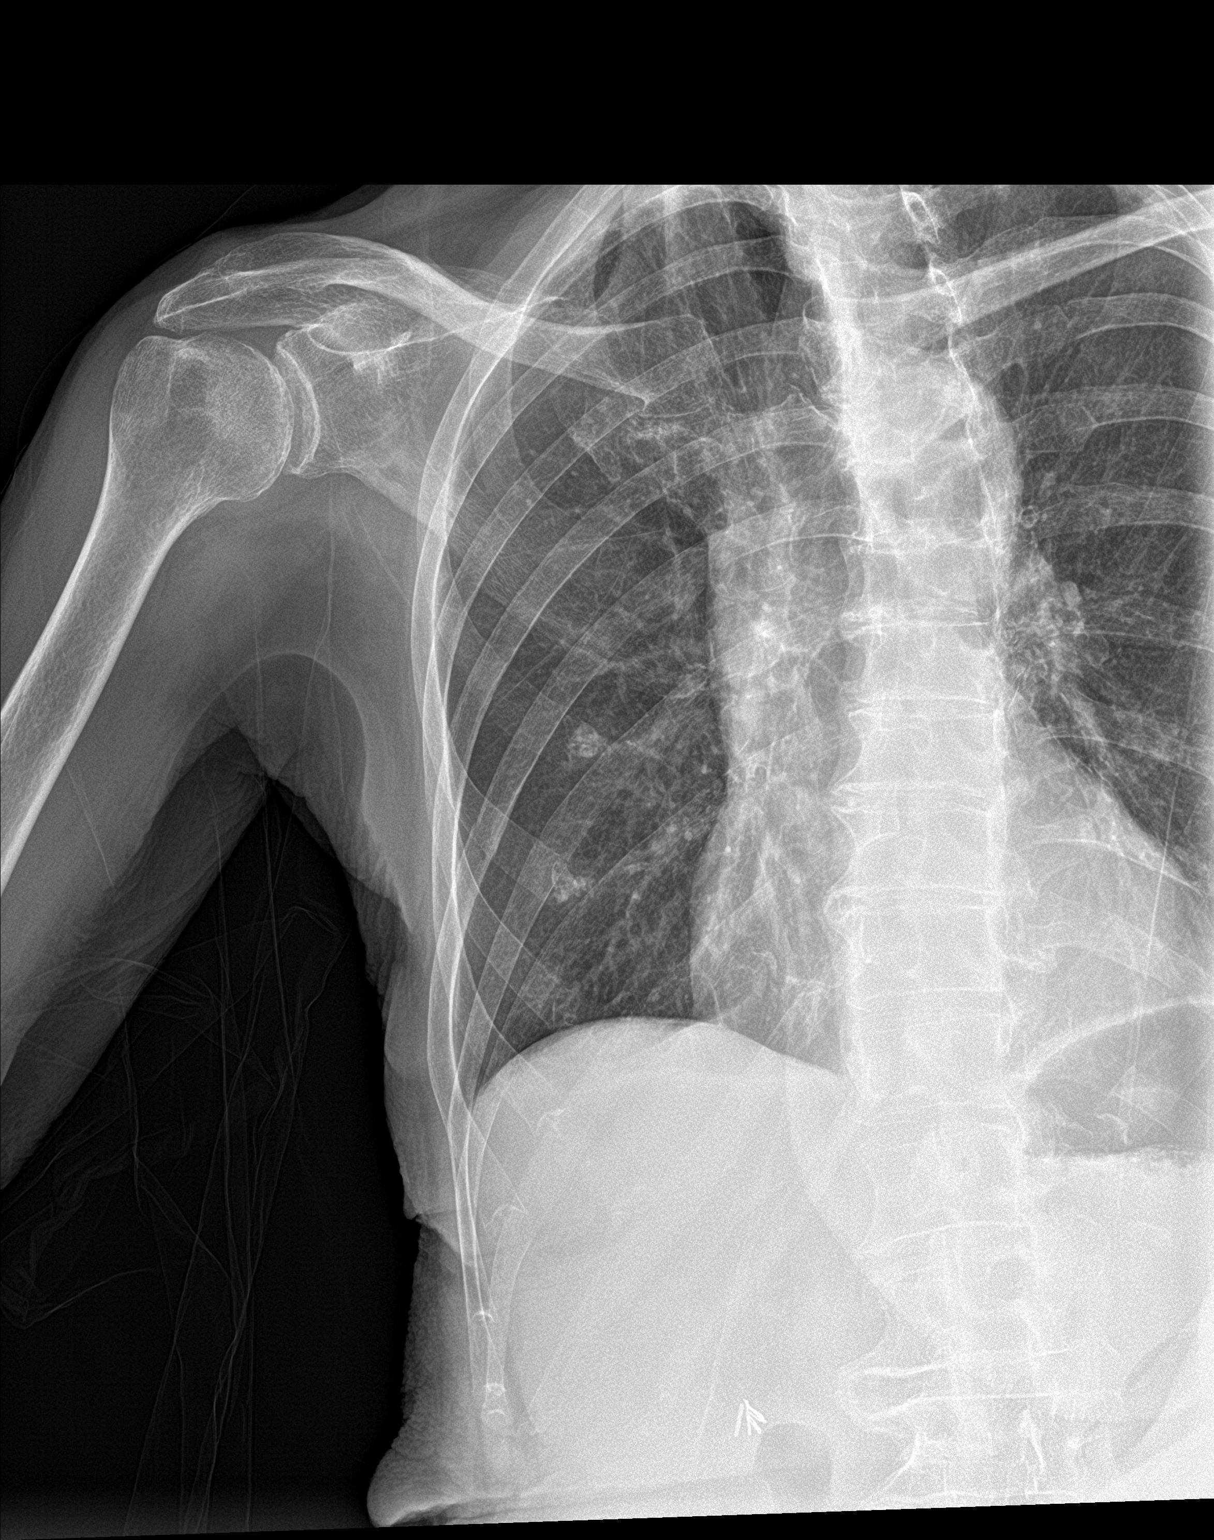

[3 of 3 positions shown; findings below may reference images not displayed]

FINDINGS: COPD with pulmonary hyperinflation and apical scarring bilaterally.
Heart size mildly enlarged. Negative for heart failure

Nondisplaced fractures right seventh and eighth ribs laterally.
Minimal effusion. No pneumothorax
IMPRESSION: Nondisplaced fractures right seventh and eighth ribs with minimal
right effusion.

COPD

## 2019-08-28 IMAGING — CR DG THORACIC SPINE 2V
3 series · 3 of 3 positions shown · non-contrast
Comparison: None.

CLINICAL DATA: Fall.  Breast cancer

EXAM:
THORACIC SPINE 2 VIEWS

[t-spine ap]
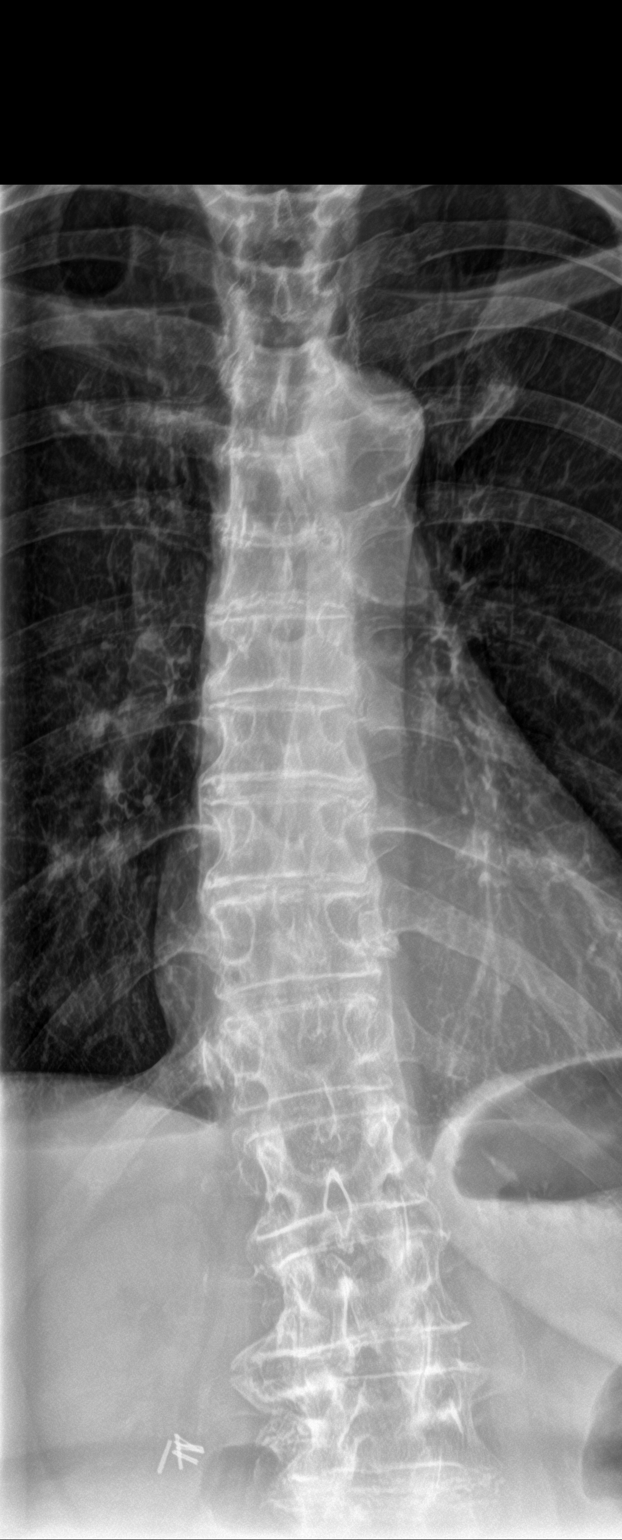

[t-spine lat]
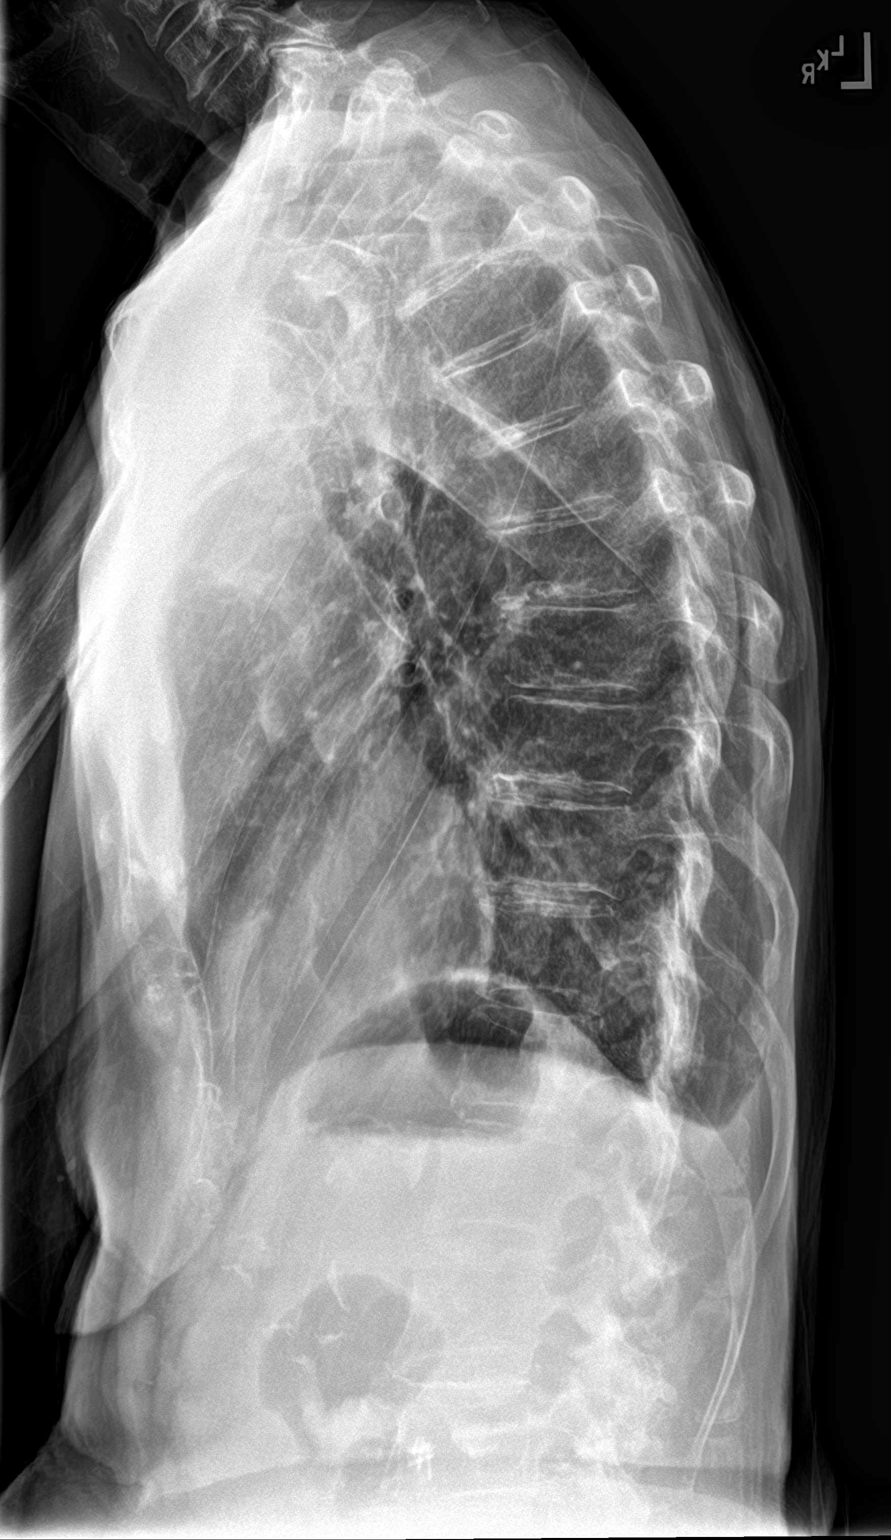

[t-spine swimmers]
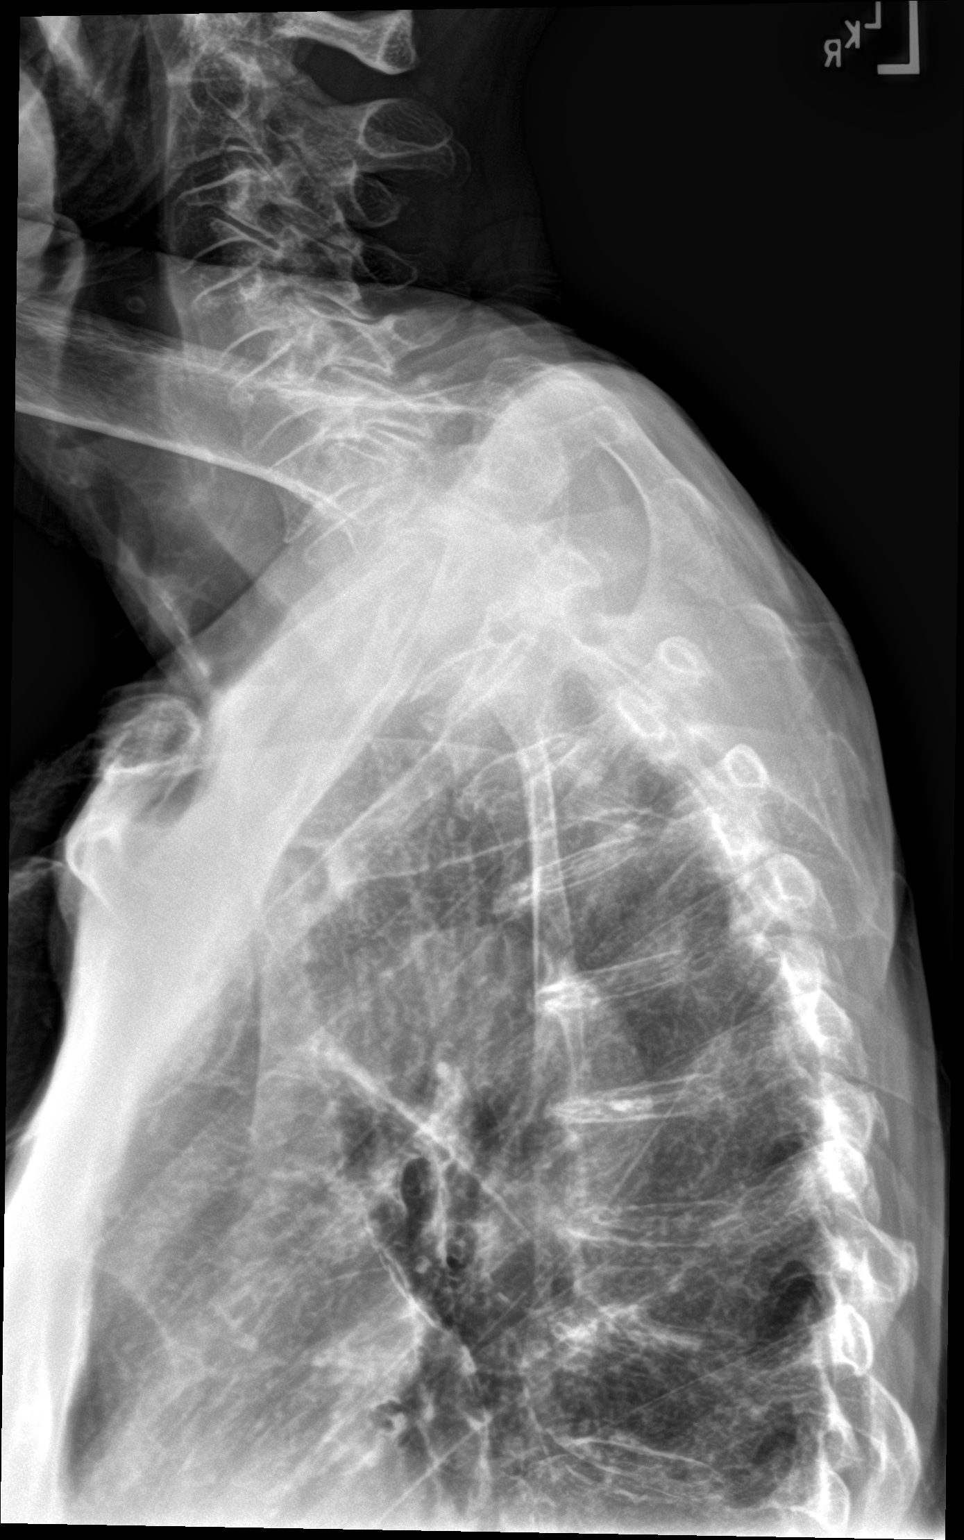

[3 of 3 positions shown; findings below may reference images not displayed]

FINDINGS: Negative for thoracic fracture or mass. Multilevel disc degeneration
and spurring of a mild degree.
IMPRESSION: Negative for fracture

## 2019-09-23 DIAGNOSIS — J40 Bronchitis, not specified as acute or chronic: Secondary | ICD-10-CM | POA: Diagnosis not present

## 2019-10-06 DIAGNOSIS — R531 Weakness: Secondary | ICD-10-CM | POA: Diagnosis not present

## 2019-10-06 DIAGNOSIS — R569 Unspecified convulsions: Secondary | ICD-10-CM | POA: Diagnosis not present

## 2019-12-18 DIAGNOSIS — F0391 Unspecified dementia with behavioral disturbance: Secondary | ICD-10-CM | POA: Diagnosis not present

## 2019-12-18 DIAGNOSIS — G479 Sleep disorder, unspecified: Secondary | ICD-10-CM | POA: Diagnosis not present

## 2019-12-18 DIAGNOSIS — F411 Generalized anxiety disorder: Secondary | ICD-10-CM | POA: Diagnosis not present

## 2020-02-11 DIAGNOSIS — L989 Disorder of the skin and subcutaneous tissue, unspecified: Secondary | ICD-10-CM | POA: Diagnosis not present

## 2020-02-11 DIAGNOSIS — J40 Bronchitis, not specified as acute or chronic: Secondary | ICD-10-CM | POA: Diagnosis not present

## 2020-02-22 DIAGNOSIS — L989 Disorder of the skin and subcutaneous tissue, unspecified: Secondary | ICD-10-CM | POA: Diagnosis not present

## 2020-03-01 DIAGNOSIS — D0461 Carcinoma in situ of skin of right upper limb, including shoulder: Secondary | ICD-10-CM | POA: Diagnosis not present

## 2020-03-01 DIAGNOSIS — L989 Disorder of the skin and subcutaneous tissue, unspecified: Secondary | ICD-10-CM | POA: Diagnosis not present

## 2020-03-14 DIAGNOSIS — D049 Carcinoma in situ of skin, unspecified: Secondary | ICD-10-CM | POA: Diagnosis not present

## 2020-04-04 DIAGNOSIS — H01004 Unspecified blepharitis left upper eyelid: Secondary | ICD-10-CM | POA: Diagnosis not present

## 2020-04-04 DIAGNOSIS — H01005 Unspecified blepharitis left lower eyelid: Secondary | ICD-10-CM | POA: Diagnosis not present

## 2020-07-02 ENCOUNTER — Emergency Department: Payer: Medicare HMO

## 2020-07-02 ENCOUNTER — Emergency Department
Admission: EM | Admit: 2020-07-02 | Discharge: 2020-07-02 | Disposition: A | Discharge status: Home / Self Care | Payer: Medicare HMO | Attending: Emergency Medicine | Admitting: Emergency Medicine

## 2020-07-02 ENCOUNTER — Other Ambulatory Visit: Payer: Self-pay

## 2020-07-02 DIAGNOSIS — S0990XA Unspecified injury of head, initial encounter: Secondary | ICD-10-CM | POA: Diagnosis present

## 2020-07-02 DIAGNOSIS — W07XXXA Fall from chair, initial encounter: Secondary | ICD-10-CM | POA: Insufficient documentation

## 2020-07-02 DIAGNOSIS — Z853 Personal history of malignant neoplasm of breast: Secondary | ICD-10-CM | POA: Insufficient documentation

## 2020-07-02 DIAGNOSIS — F0391 Unspecified dementia with behavioral disturbance: Secondary | ICD-10-CM | POA: Diagnosis not present

## 2020-07-02 DIAGNOSIS — S0083XA Contusion of other part of head, initial encounter: Secondary | ICD-10-CM

## 2020-07-02 DIAGNOSIS — Z043 Encounter for examination and observation following other accident: Secondary | ICD-10-CM | POA: Diagnosis not present

## 2020-07-02 NOTE — Discharge Instructions (Signed)
Give tylenol every 6-8 hours if needed for pain.  Apply ice to the area off and on throughout the day to help with pain and swelling.

## 2020-07-02 NOTE — ED Triage Notes (Signed)
Pt to ED via POV with daughter in law who states that pt fell. Pt has hx/o alzheimer's and per dtr in not very verbal at baseline. Pt does have hematoma to the left side of her forehead. Pt dtr in law states that pt is not on blood thinners.

## 2020-07-03 NOTE — ED Provider Notes (Signed)
[facility] Provider Note ____________________________________________   Event Date/Time   First MD Initiated Contact with Patient 07/02/20 1942     (approximate)  I have reviewed the triage vital signs and the nursing notes.   HISTORY  Chief Complaint Fall  HPI Beth Randall is a 87 y.o. female with history as listed below presents to the emergency department for treatment and evaluation after falling from a seated position.  Daughter-in-law who is her caregiver states that she was seated in chair and bent forward too far causing her to fall out of the chair.  She hit her forehead.  Incident was witnessed.  She did not lose consciousness.  She has dementia at baseline but daughter-in-law does not feel that her cognition has changed.  Patient is nonverbal but daughter-in-law has not picked up on any clue that she may have additional pain or injury.         Past Medical History:  Diagnosis Date  . Breast cancer ([place])   . Dementia [facility])     Patient Active Problem List   Diagnosis Date Noted  . Protein-calorie malnutrition, severe 02/04/2018  . Sepsis ([place]) 02/02/2018  . Ganglion cyst 07/02/2017  . Epidermoid cyst 07/02/2017  . 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 Onychomycosis 05/03/2017  . Acute pain of left knee 04/15/2017  . History of depression 03/29/2017  . Sleep disturbance 06/28/2016  . Poor balance 12/21/2015  . Gait disturbance 12/21/2015  . Dementia with behavioral disturbance ([place]) 12/21/2015  . At high risk for falls 12/21/2015  . Anxiety, generalized 12/21/2015  . SSS (sick sinus syndrome) ([place]) 03/19/2014  . Late onset Alzheimer's disease with behavioral disturbance ([place]) 06/10/2013  . Insomnia 06/10/2013    Past Surgical History:  Procedure Laterality Date  . BACK SURGERY    . MASTECTOMY Left     Prior to Admission medications   Medication Sig Start Date End Date Taking? Authorizing Provider   loratadine (CLARITIN) 10 MG tablet Take 1 tablet (10 mg total) by mouth daily. 04/28/18 04/28/19  Lavonia Drafts, MD  memantine (NAMENDA) 10 MG tablet Take 1 tablet by mouth 1 day or 1 dose.    [provider]  sertraline (ZOLOFT) 50 MG tablet Take 75 mg by mouth daily. 11/18/16   [provider]  traZODone (DESYREL) 50 MG tablet Take 50-100 mg by mouth as directed. 11/17/16   [provider]    Allergies Ibuprofen, Aspirin, and Penicillins  Family History  Problem Relation Age of Onset  . Hypertension Mother     Social History Social History   Tobacco Use  . Smoking status: Never Smoker  . Smokeless tobacco: Never Used  Substance Use Topics  . Alcohol use: No  . Drug use: No    Review of Systems  Level 5 caveat: Dementia, nonverbal ____________________________________________   PHYSICAL EXAM:  VITAL SIGNS: ED Triage Vitals  Enc Vitals Group     BP 07/02/20 1802 129/67     Pulse Rate 07/02/20 1802 73     Resp 07/02/20 1802 16     Temp 07/02/20 1802 97.8 F (36.6 C)     Temp Source 07/02/20 1802 Oral     SpO2 07/02/20 1802 100 %     Weight 07/02/20 1759 100 lb (45.4 kg)     Height 07/02/20 1759 5\' 5"  (1.651 m)     Head Circumference --      Peak Flow --  Pain Score 07/02/20 2011 0     Pain Loc --      Pain Edu? --      Excl. in [place]? --     Constitutional: Alert.  Overall well appearing and in no acute distress. Eyes: Conjunctivae are normal.  Head: Hematoma noted over the left forehead Nose: No epistaxis. Mouth/Throat: No intraoral wound. Neck: No obvious step-off or pain with palpation over cervical spine.   Hematological/Lymphatic/Immunilogical: No cervical lymphadenopathy. Cardiovascular:Kermit Balo peripheral circulation. Respiratory: Normal respiratory effort.  No retractions. Lungs CTAB. Gastrointestinal: Soft Genitourinary:  Musculoskeletal: No lower extremity trauma nor edema.  Neurologic: Confused  skin:  Skin is  warm, dry and intact.  Hematoma over the left eyebrow/left forehead noted   ____________________________________________   LABS (all labs ordered are listed, but only abnormal results are displayed)  Labs Reviewed - No data to display ____________________________________________  EKG  Not indicated ____________________________________________  RADIOLOGY  ED MD interpretation:    CT of the head and cervical spine negative for acute concerns per radiology.  I, Sherrie George, personally viewed and evaluated these images (plain radiographs) as part of my medical decision making, as well as reviewing the written report by the radiologist.  Official radiology report(s): No results found.  ____________________________________________   PROCEDURES  Procedure(s) performed (including Critical Care):  Procedures  ____________________________________________   INITIAL IMPRESSION / ASSESSMENT AND PLAN     87 year old female presents with her daughter-in-law for evaluation after falling out of her chair.  See HPI for further details.  ED COURSE  CT of the head and cervical spine are negative.  Daughter-in-law feels reassured.  She will apply ice off and on throughout the next day or so to help with pain and hematoma.  She was advised that she could give her Tylenol if needed.  She was encouraged to return with her to the emergency department if she notices any change or has any concerns.    ___________________________________________   FINAL CLINICAL IMPRESSION(S) / ED DIAGNOSES  Final diagnoses:  Traumatic hematoma of forehead, initial encounter     ED Discharge Orders    None       Beth Randall was evaluated in Emergency Department on 07/03/2020 for the symptoms described in the history of present illness. She was evaluated in the context of the global COVID-19 pandemic, which necessitated consideration that the patient might be at risk for infection with the  SARS-CoV-2 virus that causes COVID-19. Institutional protocols and algorithms that pertain to the evaluation of patients at risk for COVID-19 are in a state of rapid change based on information released by regulatory bodies including the CDC and federal and state organizations. These policies and algorithms were followed during the patient's care in the ED.   Note:  This document was prepared using Dragon voice recognition software and may include unintentional dictation errors.   Victorino Dike, FNP 07/03/20 2114    Nance Pear, MD 07/03/20 2149

## 2020-07-07 DIAGNOSIS — R0989 Other specified symptoms and signs involving the circulatory and respiratory systems: Secondary | ICD-10-CM | POA: Diagnosis not present

## 2020-07-07 DIAGNOSIS — J069 Acute upper respiratory infection, unspecified: Secondary | ICD-10-CM | POA: Diagnosis not present

## 2020-07-07 DIAGNOSIS — R8290 Unspecified abnormal findings in urine: Secondary | ICD-10-CM | POA: Diagnosis not present

## 2020-07-07 DIAGNOSIS — J90 Pleural effusion, not elsewhere classified: Secondary | ICD-10-CM | POA: Diagnosis not present

## 2020-07-13 DIAGNOSIS — R8290 Unspecified abnormal findings in urine: Secondary | ICD-10-CM | POA: Diagnosis not present

## 2020-10-19 DIAGNOSIS — R399 Unspecified symptoms and signs involving the genitourinary system: Secondary | ICD-10-CM | POA: Diagnosis not present

## 2020-12-16 DIAGNOSIS — F0391 Unspecified dementia with behavioral disturbance: Secondary | ICD-10-CM | POA: Diagnosis not present

## 2020-12-16 DIAGNOSIS — G479 Sleep disorder, unspecified: Secondary | ICD-10-CM | POA: Diagnosis not present

## 2021-04-04 DIAGNOSIS — R21 Rash and other nonspecific skin eruption: Secondary | ICD-10-CM | POA: Diagnosis not present

## 2021-04-04 DIAGNOSIS — J40 Bronchitis, not specified as acute or chronic: Secondary | ICD-10-CM | POA: Diagnosis not present

## 2021-04-04 DIAGNOSIS — I700 Atherosclerosis of aorta: Secondary | ICD-10-CM | POA: Diagnosis not present

## 2021-04-14 DIAGNOSIS — R0989 Other specified symptoms and signs involving the circulatory and respiratory systems: Secondary | ICD-10-CM | POA: Diagnosis not present

## 2021-04-14 DIAGNOSIS — R059 Cough, unspecified: Secondary | ICD-10-CM | POA: Diagnosis not present

## 2021-09-16 ENCOUNTER — Emergency Department
Admission: EM | Admit: 2021-09-16 | Discharge: 2021-09-16 | Disposition: A | Discharge status: Home / Self Care | Payer: Medicare HMO | Attending: Emergency Medicine | Admitting: Emergency Medicine

## 2021-09-16 ENCOUNTER — Other Ambulatory Visit: Payer: Self-pay

## 2021-09-16 ENCOUNTER — Emergency Department: Payer: Medicare HMO

## 2021-09-16 DIAGNOSIS — Z853 Personal history of malignant neoplasm of breast: Secondary | ICD-10-CM | POA: Diagnosis not present

## 2021-09-16 DIAGNOSIS — J90 Pleural effusion, not elsewhere classified: Secondary | ICD-10-CM

## 2021-09-16 DIAGNOSIS — F0390 Unspecified dementia without behavioral disturbance: Secondary | ICD-10-CM | POA: Insufficient documentation

## 2021-09-16 DIAGNOSIS — L03116 Cellulitis of left lower limb: Secondary | ICD-10-CM

## 2021-09-16 DIAGNOSIS — J101 Influenza due to other identified influenza virus with other respiratory manifestations: Secondary | ICD-10-CM | POA: Insufficient documentation

## 2021-09-16 DIAGNOSIS — M7989 Other specified soft tissue disorders: Secondary | ICD-10-CM | POA: Diagnosis not present

## 2021-09-16 DIAGNOSIS — Z20822 Contact with and (suspected) exposure to covid-19: Secondary | ICD-10-CM | POA: Diagnosis not present

## 2021-09-16 DIAGNOSIS — R600 Localized edema: Secondary | ICD-10-CM | POA: Diagnosis not present

## 2021-09-16 DIAGNOSIS — J918 Pleural effusion in other conditions classified elsewhere: Secondary | ICD-10-CM | POA: Insufficient documentation

## 2021-09-16 DIAGNOSIS — R55 Syncope and collapse: Secondary | ICD-10-CM

## 2021-09-16 DIAGNOSIS — R609 Edema, unspecified: Secondary | ICD-10-CM | POA: Diagnosis not present

## 2021-09-16 LAB — RESP PANEL BY RT-PCR (FLU A&B, COVID) ARPGX2
Influenza A by PCR: NEGATIVE
Influenza B by PCR: NEGATIVE
SARS Coronavirus 2 by RT PCR: NEGATIVE

## 2021-09-16 LAB — CBC
HCT: 38.2 % (ref 36.0–46.0)
Hemoglobin: 11.9 g/dL — ABNORMAL LOW (ref 12.0–15.0)
MCH: 28.5 pg (ref 26.0–34.0)
MCHC: 31.2 g/dL (ref 30.0–36.0)
MCV: 91.4 fL (ref 80.0–100.0)
Platelets: 320 10*3/uL (ref 150–400)
RBC: 4.18 MIL/uL (ref 3.87–5.11)
RDW: 13.3 % (ref 11.5–15.5)
WBC: 13.4 10*3/uL — ABNORMAL HIGH (ref 4.0–10.5)
nRBC: 0 % (ref 0.0–0.2)

## 2021-09-16 LAB — URINALYSIS, COMPLETE (UACMP) WITH MICROSCOPIC
Bacteria, UA: NONE SEEN
Bilirubin Urine: NEGATIVE
Glucose, UA: NEGATIVE mg/dL
Hgb urine dipstick: NEGATIVE
Ketones, ur: 5 mg/dL — AB
Leukocytes,Ua: NEGATIVE
Nitrite: NEGATIVE
Protein, ur: NEGATIVE mg/dL
Specific Gravity, Urine: 1.013 (ref 1.005–1.030)
pH: 5 (ref 5.0–8.0)

## 2021-09-16 LAB — COMPREHENSIVE METABOLIC PANEL
ALT: 11 U/L (ref 0–44)
AST: 14 U/L — ABNORMAL LOW (ref 15–41)
Albumin: 2.9 g/dL — ABNORMAL LOW (ref 3.5–5.0)
Alkaline Phosphatase: 46 U/L (ref 38–126)
Anion gap: 9 (ref 5–15)
BUN: 26 mg/dL — ABNORMAL HIGH (ref 8–23)
CO2: 26 mmol/L (ref 22–32)
Calcium: 8.8 mg/dL — ABNORMAL LOW (ref 8.9–10.3)
Chloride: 100 mmol/L (ref 98–111)
Creatinine, Ser: 0.65 mg/dL (ref 0.44–1.00)
GFR, Estimated: >60 mL/min (ref >60)
Glucose, Bld: 109 mg/dL — ABNORMAL HIGH (ref 70–99)
Potassium: 4.2 mmol/L (ref 3.5–5.1)
Sodium: 135 mmol/L (ref 135–145)
Total Bilirubin: 0.7 mg/dL (ref 0.3–1.2)
Total Protein: 6.7 g/dL (ref 6.5–8.1)

## 2021-09-16 LAB — TROPONIN I (HIGH SENSITIVITY): Troponin I (High Sensitivity): 11 ng/L (ref <18)

## 2021-09-16 MED ORDER — DOXYCYCLINE HYCLATE 100 MG PO TABS
100.0000 mg | ORAL_TABLET | Freq: Once | ORAL | Status: AC
  Administered 2021-09-16: 100 mg via ORAL
  Filled 2021-09-16: qty 1

## 2021-09-16 MED ORDER — CLINDAMYCIN PALMITATE HCL 75 MG/5ML PO SOLR: 450.0000 mg | Freq: Three times a day (TID) | ORAL | 0 refills | Status: DC

## 2021-09-16 MED ORDER — DOXYCYCLINE HYCLATE 100 MG PO CAPS: 100.0000 mg | ORAL_CAPSULE | Freq: Two times a day (BID) | ORAL | 0 refills | Status: AC

## 2021-09-16 NOTE — ED Notes (Signed)
X RAY at bedside 

## 2021-09-16 NOTE — ED Provider Notes (Signed)
? ?[facility] ?Provider Note ? ? ? Event Date/Time  ? First MD Initiated Contact with Patient 09/16/21 1642   ?  (approximate) ? ? ?History  ? ?Near Syncope ? ? ?HPI ? ?Beth Randall is a 88 y.o. female with a past medical history of dementia and and breast cancer who presents accompanied by family who cares for her at home for evaluation of a syncopal episode.  Per family she is minimally verbal at baseline and nonambulatory.  They were moving into a bathtub and when she got up into the tub she passed out for about 20 seconds.  No shaking activity.  She has a little congestion has otherwise been eating appropriately and making adequate urine.  No cough, fever or other.  Pain.  They do note there is some redness on the left foot where she bumped it while they are moving her 3 days ago.  No other recent injuries or sick symptoms they are aware of.  Patient is on providing history on arrival secondary to being minimally verbal.  She does not answer any direct questions for this examiner. ? ?Per family at bedside patient patient is DNR/DNI I would normal any significant aggressive interventions at this point in her life. ? ?  ? ? ?Physical Exam  ?Triage Vital Signs: ?ED Triage Vitals  ?Enc Vitals Group  ?   BP 09/16/21 1636 105/63  ?   Pulse Rate 09/16/21 1636 86  ?   Resp 09/16/21 1636 14  ?   Temp 09/16/21 1635 98.1 ?F (36.7 ?C)  ?   Temp Source 09/16/21 1635 Oral  ?   SpO2 09/16/21 1636 96 %  ?   Weight --   ?   Height 09/16/21 1633 '5\' 5"'$  (1.651 m)  ?   Head Circumference --   ?   Peak Flow --   ?   Pain Score --   ?   Pain Loc --   ?   Pain Edu? --   ?   Excl. in [place]? --   ? ? ?Most recent vital signs: ?Vitals:  ? 09/16/21 1730 09/16/21 1830  ?BP: (!) 102/57 110/64  ?Pulse: 80 80  ?Resp: 16 19  ?Temp:    ?SpO2: 98% 96%  ? ? ?General: Awake, no distress.  Resting calmly with eyes closed. ?CV:  Good peripheral perfusion.  2+ radial and DP pulses.  Slight systolic murmur ?Resp:  Normal effort.   Nonlabored and clear bilaterally. ?Abd:  No distention.  Soft. ?Other:  Some erythema edema and warmth over the dorsum of the left foot.  No fluctuance streaking or involvement of the ankle joint.  Right foot is unremarkable.  Hands are unremarkable.  Patient does not open his eyes spontaneously but pupils are unremarkable ? ? ?ED Results / Procedures / Treatments  ?Labs ?(all labs ordered are listed, but only abnormal results are displayed) ?Labs Reviewed  ?CBC - Abnormal; Notable for the following components:  ?    Result Value  ? WBC 13.4 (*)   ? Hemoglobin 11.9 (*)   ? All other components within normal limits  ?COMPREHENSIVE METABOLIC PANEL - Abnormal; Notable for the following components:  ? Glucose, Bld 109 (*)   ? BUN 26 (*)   ? Calcium 8.8 (*)   ? Albumin 2.9 (*)   ? AST 14 (*)   ? All other components within normal limits  ?URINALYSIS, COMPLETE (UACMP) WITH MICROSCOPIC - Abnormal; Notable for the following  components:  ? Color, Urine YELLOW (*)   ? APPearance CLEAR (*)   ? Ketones, ur 5 (*)   ? All other components within normal limits  ?RESP PANEL BY RT-PCR (FLU A&B, COVID) ARPGX2  ?CBG MONITORING, ED  ?TROPONIN I (HIGH SENSITIVITY)  ?TROPONIN I (HIGH SENSITIVITY)  ? ? ? ?EKG ? ?ECGs remarkable for sinus rhythm with a ventricular rate of 87, left axis deviation, incomplete left bundle branch block and some nonspecific change versus artifact in lead I and lead II as well as aVR and Q waves in inferior leads. ? ? ?RADIOLOGY ?Chest x-ray on my interpretation without clear focal consolidation, pneumothorax or overt edema.  I appear to be small bowel pleural effusions.  I also viewed radiology dictation and agree with their findings of appears to be a left base airspace opacity of unclear etiology not clearly suggestive of infection.  They recommend follow-up in 3 to 4 weeks to exclude pregnancy and to test for resolution. ? ?Left lower extremity ultrasound shows no evidence of DVT on my interpretation.  I  also reviewed radiologist interpretation. ? ?Plain film the left foot on interpretation shows no acute fracture dislocation.  Also reviewed radiologist rotation and agree with the findings of some soft tissue swelling. ? ? ?PROCEDURES: ? ?Critical Care performed: No ? ?Procedures ? ? ? ?MEDICATIONS ORDERED IN ED: ?Medications  ?doxycycline (VIBRA-TABS) tablet 100 mg (has no administration in time range)  ? ? ? ?IMPRESSION / MDM / ASSESSMENT AND PLAN / ED COURSE  ?I reviewed the triage vital signs and the nursing notes. ?             ?               ? ?Differential diagnosis includes, but is not limited to CVA, ACS, arrhythmia, anemia, PE, metabolic derangements, dehydration, and acute cardiomyopathy. ? ?ECGs remarkable for sinus rhythm with a ventricular rate of 87, left axis deviation, incomplete left bundle branch block and some nonspecific change versus artifact in lead I and lead II as well as aVR and Q waves in inferior leads.  Nonelevated troponin not suggestive of ACS.  COVID influenza PCR negative.  UA not suggestive of cystitis.  CMP without any other significant electrolyte or metabolic derangements.  CBC with WBC count of 13.4 without evidence of thrombocytopenia and hemoglobin of 11.9 without recent to compare to I have a lower suspicion for acute symptomatic anemia at this time. ? ?Chest x-ray on my interpretation without clear focal consolidation, pneumothorax or overt edema.  I appear to be small bowel pleural effusions.  I also viewed radiology dictation and agree with their findings of appears to be a left base airspace opacity of unclear etiology not clearly suggestive of infection.  They recommend follow-up in 3 to 4 weeks to exclude pregnancy and to test for resolution. ? ?While patient has no cough or fever she has had a little bit of congestion ?The lower suspicion for pneumonia and is difficult to exclude this based on his chest x-ray. ? ?With regard to the left foot differential  considerations include cellulitis, underlying fracture possible DVT in his calf slightly swollen compared to the right. ? ?Plain film the left foot on interpretation shows no acute fracture dislocation.  Also reviewed radiologist rotation and agree with the findings of some soft tissue swelling.  I suspect likely cellulitis.  Given absence of any evidence of DVT and no abnormal vital signs or reports of shortness of breath or  evidence of rest or distress however less patient for PE at this time.  Will write Rx for doxycycline.  This will also cover for possible CAP which have a lower suspicion for this time.  They will follow-up with PCP next week.  I discussed recommendation to follow-up chest x-ray with PCP and schedule repeat in 3 to 4 weeks to assess for resolution of opacity if this is within patient's goals of care.  They are understanding and agreeable with this plan.  Recommend returning for any new or worsening symptoms.  Discharged in stable condition. ? ? ? ?  ? ? ?FINAL CLINICAL IMPRESSION(S) / ED DIAGNOSES  ? ?Final diagnoses:  ?Syncope, unspecified syncope type  ?Cellulitis of left lower extremity  ?Pleural effusion  ? ? ? ?Rx / DC Orders  ? ?ED Discharge Orders   ? ?      Ordered  ?  clindamycin (CLEOCIN) 75 MG/5ML solution  3 times daily,   Status:  Discontinued       ? 09/16/21 1844  ?  doxycycline (VIBRAMYCIN) 100 MG capsule  2 times daily       ? 09/16/21 1847  ? ?  ?  ? ?  ? ? ? ?Note:  This document was prepared using Dragon voice recognition software and may include unintentional dictation errors. ?  ?Lucrezia Starch, MD ?09/16/21 1853 ? ?

## 2021-09-16 NOTE — ED Notes (Signed)
D/C and new x and reasons to return discussed with family and pt, all verbalized understanding. NAD noted on D/C. VSS.  ?

## 2021-09-16 NOTE — ED Triage Notes (Addendum)
Patient to ER via POV with family. Family report patient has had progressive weakness and poor appetite x2 days. State today when they got her up to take a bath she had a syncopal episode, reports she was unconscious for 20-30 seconds. Did not fall/ hit head. Patient now back at baseline.  ?

## 2021-09-16 NOTE — Discharge Instructions (Addendum)
As we discussed chest x-ray today shows: ?IMPRESSION: ?Interval development of a trace right pleural effusion. Likely trace ?left pleural effusion with left base airspace opacity. Followup PA ?and lateral chest X-ray is recommended in 3-4 weeks following ?therapy to ensure resolution and exclude underlying malignancy. ? ?Follow-up x-ray can be coordinated by patient's primary care doctor. ? ? ?

## 2021-09-16 NOTE — ED Notes (Signed)
US at bedside

## 2021-09-16 NOTE — ED Notes (Signed)
Pt given water per request from family, MD states this is okay  ?

## 2021-09-16 NOTE — ED Notes (Addendum)
Pt presents to ED with c/o of having increasing weakness and not having her normal appetite for the past few days. Pt also presents with swollen and red R foot due to hitting it on the bathtub about 4 days ago. Family states pt had a near syncope episode today while getting her in the bathtub, pt states pt did fall but did not hit her head.  ? ?Pt not speaking at this time, family providing information for her at this time. Pt does not appear to be in any pain at this time. Pt will open eyes and make contact when asked. Family states this is pt's baseline.  ?

## 2021-09-26 DIAGNOSIS — L03119 Cellulitis of unspecified part of limb: Secondary | ICD-10-CM | POA: Diagnosis not present

## 2021-09-26 DIAGNOSIS — J90 Pleural effusion, not elsewhere classified: Secondary | ICD-10-CM | POA: Diagnosis not present

## 2021-09-26 DIAGNOSIS — R062 Wheezing: Secondary | ICD-10-CM | POA: Diagnosis not present

## 2021-09-26 DIAGNOSIS — R55 Syncope and collapse: Secondary | ICD-10-CM | POA: Diagnosis not present

## 2021-09-28 DIAGNOSIS — F03918 Unspecified dementia, unspecified severity, with other behavioral disturbance: Secondary | ICD-10-CM | POA: Diagnosis not present

## 2021-09-28 DIAGNOSIS — E43 Unspecified severe protein-calorie malnutrition: Secondary | ICD-10-CM | POA: Diagnosis not present

## 2021-09-28 DIAGNOSIS — I495 Sick sinus syndrome: Secondary | ICD-10-CM | POA: Diagnosis not present

## 2021-09-28 DIAGNOSIS — R55 Syncope and collapse: Secondary | ICD-10-CM | POA: Diagnosis not present

## 2021-10-16 DIAGNOSIS — Z853 Personal history of malignant neoplasm of breast: Secondary | ICD-10-CM | POA: Diagnosis not present

## 2021-10-16 DIAGNOSIS — Z9011 Acquired absence of right breast and nipple: Secondary | ICD-10-CM | POA: Diagnosis not present

## 2021-10-16 DIAGNOSIS — Z86010 Personal history of colonic polyps: Secondary | ICD-10-CM | POA: Diagnosis not present

## 2021-10-16 DIAGNOSIS — F0390 Unspecified dementia without behavioral disturbance: Secondary | ICD-10-CM | POA: Diagnosis not present

## 2021-10-16 DIAGNOSIS — Z9181 History of falling: Secondary | ICD-10-CM | POA: Diagnosis not present

## 2021-10-16 DIAGNOSIS — Z85828 Personal history of other malignant neoplasm of skin: Secondary | ICD-10-CM | POA: Diagnosis not present

## 2021-10-16 DIAGNOSIS — J90 Pleural effusion, not elsewhere classified: Secondary | ICD-10-CM | POA: Diagnosis not present

## 2021-12-20 DIAGNOSIS — H1033 Unspecified acute conjunctivitis, bilateral: Secondary | ICD-10-CM | POA: Diagnosis not present

## 2021-12-20 DIAGNOSIS — L570 Actinic keratosis: Secondary | ICD-10-CM | POA: Diagnosis not present

## 2022-01-03 ENCOUNTER — Emergency Department: Payer: Medicare HMO

## 2022-01-03 ENCOUNTER — Emergency Department
Admission: EM | Admit: 2022-01-03 | Discharge: 2022-01-04 | Disposition: A | Discharge status: Home / Self Care | Payer: Medicare HMO | Attending: Emergency Medicine | Admitting: Emergency Medicine

## 2022-01-03 ENCOUNTER — Encounter: Payer: Self-pay | Admitting: Emergency Medicine

## 2022-01-03 ENCOUNTER — Other Ambulatory Visit: Payer: Self-pay

## 2022-01-03 DIAGNOSIS — R918 Other nonspecific abnormal finding of lung field: Secondary | ICD-10-CM | POA: Diagnosis not present

## 2022-01-03 DIAGNOSIS — F0390 Unspecified dementia without behavioral disturbance: Secondary | ICD-10-CM | POA: Diagnosis not present

## 2022-01-03 DIAGNOSIS — R5383 Other fatigue: Secondary | ICD-10-CM | POA: Diagnosis not present

## 2022-01-03 DIAGNOSIS — R627 Adult failure to thrive: Secondary | ICD-10-CM | POA: Insufficient documentation

## 2022-01-03 DIAGNOSIS — R531 Weakness: Secondary | ICD-10-CM | POA: Diagnosis not present

## 2022-01-03 DIAGNOSIS — Z20822 Contact with and (suspected) exposure to covid-19: Secondary | ICD-10-CM | POA: Insufficient documentation

## 2022-01-03 LAB — CBC WITH DIFFERENTIAL/PLATELET
Abs Immature Granulocytes: 0.05 10*3/uL (ref 0.00–0.07)
Basophils Absolute: 0 10*3/uL (ref 0.0–0.1)
Basophils Relative: 0 %
Eosinophils Absolute: 0.2 10*3/uL (ref 0.0–0.5)
Eosinophils Relative: 2 %
HCT: 40.7 % (ref 36.0–46.0)
Hemoglobin: 12.8 g/dL (ref 12.0–15.0)
Immature Granulocytes: 0 %
Lymphocytes Relative: 23 %
Lymphs Abs: 3.1 10*3/uL (ref 0.7–4.0)
MCH: 28.8 pg (ref 26.0–34.0)
MCHC: 31.4 g/dL (ref 30.0–36.0)
MCV: 91.7 fL (ref 80.0–100.0)
Monocytes Absolute: 1.2 10*3/uL — ABNORMAL HIGH (ref 0.1–1.0)
Monocytes Relative: 9 %
Neutro Abs: 8.7 10*3/uL — ABNORMAL HIGH (ref 1.7–7.7)
Neutrophils Relative %: 66 %
Platelets: 197 10*3/uL (ref 150–400)
RBC: 4.44 MIL/uL (ref 3.87–5.11)
RDW: 14.3 % (ref 11.5–15.5)
WBC: 13.3 10*3/uL — ABNORMAL HIGH (ref 4.0–10.5)
nRBC: 0 % (ref 0.0–0.2)

## 2022-01-03 LAB — RESP PANEL BY RT-PCR (FLU A&B, COVID) ARPGX2
Influenza A by PCR: NEGATIVE
Influenza B by PCR: NEGATIVE
SARS Coronavirus 2 by RT PCR: NEGATIVE

## 2022-01-03 LAB — BASIC METABOLIC PANEL
Anion gap: 9 (ref 5–15)
BUN: 33 mg/dL — ABNORMAL HIGH (ref 8–23)
CO2: 25 mmol/L (ref 22–32)
Calcium: 9.2 mg/dL (ref 8.9–10.3)
Chloride: 104 mmol/L (ref 98–111)
Creatinine, Ser: 0.86 mg/dL (ref 0.44–1.00)
GFR, Estimated: >60 mL/min (ref >60)
Glucose, Bld: 102 mg/dL — ABNORMAL HIGH (ref 70–99)
Potassium: 4.5 mmol/L (ref 3.5–5.1)
Sodium: 138 mmol/L (ref 135–145)

## 2022-01-03 LAB — URINALYSIS, ROUTINE W REFLEX MICROSCOPIC
Bilirubin Urine: NEGATIVE
Glucose, UA: NEGATIVE mg/dL
Hgb urine dipstick: NEGATIVE
Ketones, ur: NEGATIVE mg/dL
Leukocytes,Ua: NEGATIVE
Nitrite: NEGATIVE
Protein, ur: NEGATIVE mg/dL
Specific Gravity, Urine: 1.01 (ref 1.005–1.030)
pH: 5 (ref 5.0–8.0)

## 2022-01-03 LAB — TROPONIN I (HIGH SENSITIVITY)
Troponin I (High Sensitivity): 10 ng/L (ref <18)
Troponin I (High Sensitivity): 9 ng/L (ref <18)

## 2022-01-03 NOTE — ED Triage Notes (Signed)
Patient brought in by son and daughter in law stating patient has been having congestion over the past 4 days. States patient has been less responsive today and not talking at all. Report patient being lethargic today and sleeping most of the day. Daughter states normal bowel movements and no foul urine smell.

## 2022-01-03 NOTE — ED Provider Triage Note (Signed)
Emergency Medicine Provider Triage Evaluation Note  Beth Randall, a 88 y.o. female  was evaluated in triage.  Pt complains of cough and congestion for the last 4 days and less responsive today.  Patient is brought in by her son and daughter-in-law, who are her primary caregivers.  They deny any fevers, chills, or sweats.  They also denies any bowel changes or foul-smelling urine.  Review of Systems  Positive: weakness Negative: FCS, NVD  Physical Exam  BP 110/68 (BP Location: Left Arm)   Pulse 81   Temp (!) 97 F (36.1 C) (Axillary)   Resp 16   Ht '5\' 5"'$  (1.651 m)   Wt 45.4 kg   SpO2 95%   BMI 16.64 kg/m  Gen:   Awake, no distress  NAD Resp:  Normal effort CTA MSK:   Moves extremities without difficulty  Other:    Medical Decision Making  Medically screening exam initiated at 6:46 PM.  Appropriate orders placed.  DOUGLAS ROOKS was informed that the remainder of the evaluation will be completed by another provider, this initial triage assessment does not replace that evaluation, and the importance of remaining in the ED until their evaluation is complete.  Geriatric patient to the ED for evaluation of 4 days of cough and congestion with some decreased activity and responsiveness today.   Melvenia Needles, PA-C 01/03/22 1853

## 2022-01-04 NOTE — ED Notes (Signed)
E-signature pad unavailable - Pts Son verbalized understanding of D/C information - no additional concerns at this time.

## 2022-01-04 NOTE — ED Provider Notes (Signed)
[facility] Provider Note    Event Date/Time   First MD Initiated Contact with Patient 01/03/22 2314     (approximate)   History   Weakness   HPI Level 5 caveat: History is limited by the patient's chronic dementia and age.   LYNISE PORR is a 88 y.o. female who is here with her son and daughter-in-law.  As per her medical history and confirmation by family, she has dementia with some behavioral disturbance.  They presents tonight because the family is concerned that the patient has had nasal congestion for the past 4 days.  She has had an overall decline in her status and has been talking less than usual.  They report that she has been "lethargic" today and spending most of the day sleeping.  However they also reported that she has had normal bowel movements and has had no foul-smelling urine.  She has not been vomiting.  She has not complained of any pain.  She has not been having any trouble breathing.  They said that she does not participate in very many activities during the day.  They get "a little bit of exercise together", but that is about it.  Reportedly they were going to go to her primary care doctor today, but the PCPs office recommended they come to the emergency department instead.     Physical Exam   Triage Vital Signs: ED Triage Vitals [01/03/22 1841]  Enc Vitals Group     BP 110/68     Pulse Rate 81     Resp 16     Temp (!) 97 F (36.1 C)     Temp Source Axillary     SpO2 95 %     Weight 45.4 kg (100 lb)     Height 1.651 m ('5\' 5"'$ )     Head Circumference      Peak Flow      Pain Score      Pain Loc      Pain Edu?      Excl. in [place]?     Most recent vital signs: Vitals:   01/03/22 1841 01/03/22 2059  BP: 110/68 107/65  Pulse: 81 84  Resp: 16 16  Temp: (!) 97 F (36.1 C) 98.7 F (37.1 C)  SpO2: 95% 97%     General: Sleeping but awakens to light touch and voice.  No distress.  Very elderly, somewhat  cachectic. CV:  Good peripheral perfusion.  Normal heart sounds. Resp:  Normal effort.  No accessory muscle usage or intercostal retractions.  Lungs are clear to auscultation bilaterally. Abd:  No distention.  No tenderness to palpation of the abdomen. Other:  Patient responded verbally to simple questions.  She denied having pain, said that yes, she was sleepy.  She said she knew who the man was in the room but could not verbally identify him as her son.   ED Results / Procedures / Treatments   Labs (all labs ordered are listed, but only abnormal results are displayed) Labs Reviewed  BASIC METABOLIC PANEL - Abnormal; Notable for the following components:      Result Value   Glucose, Bld 102 (*)    BUN 33 (*)    All other components within normal limits  CBC WITH DIFFERENTIAL/PLATELET - Abnormal; Notable for the following components:   WBC 13.3 (*)    Neutro Abs 8.7 (*)    Monocytes Absolute 1.2 (*)    All other components within  normal limits  URINALYSIS, ROUTINE W REFLEX MICROSCOPIC - Abnormal; Notable for the following components:   Color, Urine YELLOW (*)    APPearance HAZY (*)    All other components within normal limits  RESP PANEL BY RT-PCR (FLU A&B, COVID) ARPGX2  TROPONIN I (HIGH SENSITIVITY)  TROPONIN I (HIGH SENSITIVITY)     EKG  ED ECG REPORT I, Hinda Kehr, the attending physician, personally viewed and interpreted this ECG.  Date: 01/03/2022 EKG Time: 18: 49 Rate: 77 Rhythm: Sinus rhythm with first-degree AV block QRS Axis: Left axis deviation Intervals: Incomplete right bundle branch block, PR interval is 234 ms ST/T Wave abnormalities: Non-specific ST segment / T-wave changes, but no clear evidence of acute ischemia. Narrative Interpretation: no definitive evidence of acute ischemia; does not meet STEMI criteria.    RADIOLOGY I viewed and interpreted the patient's 1 view chest x-ray.  There is no evidence of consolidation or pulmonary edema.  The  radiologist report indicates that there is no significant change from prior, likely some chronic scarring.    PROCEDURES:  Critical Care performed: No  Procedures   MEDICATIONS ORDERED IN ED: Medications - No data to display   IMPRESSION / MDM / [place] / ED COURSE  I reviewed the triage vital signs and the nursing notes.                              Differential diagnosis includes, but is not limited to, deconditioning, malnutrition, failure to thrive, electrolyte or metabolic abnormality, renal dysfunction, less likely CVA or ACS.  Patient's presentation is most consistent with acute presentation with potential threat to life or bodily function.  Vital signs are stable and within normal limits.  Labs/studies ordered include EKG, 1 view chest x-ray, CBC with differential, BMP, urinalysis, high-sensitivity troponin x2, respiratory viral panel, basic metabolic panel.  In spite of the patient's age and overall decline in status, her evaluation is generally reassuring.  She has no concerning physical exam findings including no tenderness to palpation.  She awakens to touch and voice and is able to answer some basic questions.  No sign of ischemia on EKG.  Chest x-ray shows some chronic scarring but no acute abnormalities.  No evidence of UTI, significant electrolyte abnormality, nor demand ischemia.  Mild leukocytosis of 13.3, unclear clinical significance.  She has no respiratory distress at all and her lungs are clear.  I had what I hope was a realistic conversation with the patient's family.  We talked about how the reported nasal congestion is not necessarily worrisome, but at her age and with her dementia and malnutrition, I acknowledge that her clinical status can change very quickly.  I considered hospitalization, mostly just given her age and her decreased overall status, but I explained to her family that at this point I do not see a specific reason or benefit to  hospitalization.  Her son reassured me that they were not looking for her to stay in the hospital necessarily, they just wanted to "make sure everything was okay".  I reassured them that at this point I do not see evidence of an emergent medical condition, but that we must be realistic about her age and that she could get worse very quickly for no specific reason other than that her body is wearing out.  He and his wife seem to understand and acknowledge this reality.    I initiated a discussion of CODE  STATUS.  Her son, who confirmed that he is the healthcare power of attorney, said he was familiar with the concept because they went through with his father, but they have not had a specific conversation with the patient's PCP about CODE STATUS.  I had my usual and customary CODE STATUS/DNR discussion with them and encouraged them to discuss it with the PCP because she would be a good candidate for DNR status.  They were very receptive to this discussion and will follow-up as an outpatient.  I gave my usual return precautions.     FINAL CLINICAL IMPRESSION(S) / ED DIAGNOSES   Final diagnoses:  Generalized weakness  Failure to thrive in adult     Rx / DC Orders   ED Discharge Orders     None        Note:  This document was prepared using Dragon voice recognition software and may include unintentional dictation errors.   Hinda Kehr, MD 01/04/22 [phone]

## 2022-01-04 NOTE — Discharge Instructions (Signed)
Your workup in the Emergency Department today was reassuring.  We did not find any specific abnormalities.  We recommend you drink plenty of fluids, take your regular medications and/or any new ones prescribed today, and follow up with the doctor(s) listed in these documents as recommended.  Return to the Emergency Department if you develop new or worsening symptoms that concern you.  

## 2022-01-04 NOTE — ED Provider Notes (Incomplete)
[facility] Provider Note    Event Date/Time   First MD Initiated Contact with Patient 01/03/22 2314     (approximate)   History   Weakness   HPI Level 5 caveat: History is limited by the patient's chronic dementia and age.   Beth Randall is a 88 y.o. female  ***       Physical Exam   Triage Vital Signs: ED Triage Vitals [01/03/22 1841]  Enc Vitals Group     BP 110/68     Pulse Rate 81     Resp 16     Temp (!) 97 F (36.1 C)     Temp Source Axillary     SpO2 95 %     Weight 45.4 kg (100 lb)     Height 1.651 m ('5\' 5"'$ )     Head Circumference      Peak Flow      Pain Score      Pain Loc      Pain Edu?      Excl. in [place]?     Most recent vital signs: Vitals:   01/03/22 1841 01/03/22 2059  BP: 110/68 107/65  Pulse: 81 84  Resp: 16 16  Temp: (!) 97 F (36.1 C) 98.7 F (37.1 C)  SpO2: 95% 97%    {Only need to document appropriate and relevant physical exam:1} General: Awake, no distress. *** CV:  Good peripheral perfusion. *** Resp:  Normal effort. *** Abd:  No distention. *** Other:  ***   ED Results / Procedures / Treatments   Labs (all labs ordered are listed, but only abnormal results are displayed) Labs Reviewed  BASIC METABOLIC PANEL - Abnormal; Notable for the following components:      Result Value   Glucose, Bld 102 (*)    BUN 33 (*)    All other components within normal limits  CBC WITH DIFFERENTIAL/PLATELET - Abnormal; Notable for the following components:   WBC 13.3 (*)    Neutro Abs 8.7 (*)    Monocytes Absolute 1.2 (*)    All other components within normal limits  URINALYSIS, ROUTINE W REFLEX MICROSCOPIC - Abnormal; Notable for the following components:   Color, Urine YELLOW (*)    APPearance HAZY (*)    All other components within normal limits  RESP PANEL BY RT-PCR (FLU A&B, COVID) ARPGX2  TROPONIN I (HIGH SENSITIVITY)  TROPONIN I (HIGH SENSITIVITY)     EKG  ***   RADIOLOGY *** {USE THE  WORD "INTERPRETED"!! You MUST document your own interpretation of imaging, as well as the fact that you reviewed the radiologist's report!:1}   PROCEDURES:  Critical Care performed: {CriticalCareYesNo:19197::"Yes, see critical care procedure note(s)","No"}  Procedures   MEDICATIONS ORDERED IN ED: Medications - No data to display   IMPRESSION / MDM / [place] / ED COURSE  I reviewed the triage vital signs and the nursing notes.                              Differential diagnosis includes, but is not limited to, ***  Patient's presentation is most consistent with {EM COPA:27473}  {If the patient is on the monitor, remove the brackets and asterisks on the sentence below and remember to document it as a Procedure as well. Otherwise delete the sentence below:1} {**The patient is on the cardiac monitor to evaluate for evidence of arrhythmia and/or significant heart rate  changes.**} {Remember to include, when applicable, any/all of the following data: independent review of imaging independent review of labs (comment specifically on pertinent positives and negatives) review of specific prior hospitalizations, PCP/specialist notes, etc. discuss meds given and prescribed document any discussion with consultants (including hospitalists) any clinical decision tools you used and why (PECARN, NEXUS, etc.) did you consider admitting the patient? document social determinants of health affecting patient's care (homelessness, inability to follow up in a timely fashion, etc) document any pre-existing conditions increasing risk on current visit (e.g. diabetes and HTN increasing danger of high-risk chest pain/ACS) describes what meds you gave (especially parenteral) and why any other interventions?:1}     FINAL CLINICAL IMPRESSION(S) / ED DIAGNOSES   Final diagnoses:  None     Rx / DC Orders   ED Discharge Orders     None        Note:  This document was prepared using  Dragon voice recognition software and may include unintentional dictation errors.

## 2022-02-15 DIAGNOSIS — M3501 Sicca syndrome with keratoconjunctivitis: Secondary | ICD-10-CM | POA: Diagnosis not present

## 2022-03-13 DIAGNOSIS — R399 Unspecified symptoms and signs involving the genitourinary system: Secondary | ICD-10-CM | POA: Diagnosis not present

## 2022-04-02 ENCOUNTER — Ambulatory Visit: Payer: Medicare Other | Admitting: Urology

## 2022-04-12 ENCOUNTER — Ambulatory Visit: Payer: Medicare Other | Admitting: Urology

## 2022-04-16 ENCOUNTER — Encounter: Payer: Self-pay | Admitting: Urology

## 2022-04-16 ENCOUNTER — Ambulatory Visit: Payer: Medicare HMO | Admitting: Urology

## 2022-04-16 VITALS — BP 100/70 | HR 68 | Ht 65.0 in | Wt 100.0 lb

## 2022-04-16 DIAGNOSIS — R3981 Functional urinary incontinence: Secondary | ICD-10-CM

## 2022-04-16 DIAGNOSIS — Z87440 Personal history of urinary (tract) infections: Secondary | ICD-10-CM | POA: Diagnosis not present

## 2022-04-16 DIAGNOSIS — R8271 Bacteriuria: Secondary | ICD-10-CM | POA: Diagnosis not present

## 2022-04-16 DIAGNOSIS — R350 Frequency of micturition: Secondary | ICD-10-CM | POA: Diagnosis not present

## 2022-04-16 LAB — MICROSCOPIC EXAMINATION: Epithelial Cells (non renal): >10 /hpf — AB (ref 0–10)

## 2022-04-16 LAB — URINALYSIS, COMPLETE
Bilirubin, UA: NEGATIVE
Glucose, UA: NEGATIVE
Ketones, UA: NEGATIVE
Leukocytes,UA: NEGATIVE
Nitrite, UA: NEGATIVE
Protein,UA: NEGATIVE
RBC, UA: NEGATIVE
Specific Gravity, UA: 1.025 (ref 1.005–1.030)
Urobilinogen, Ur: 0.2 mg/dL (ref 0.2–1.0)
pH, UA: 5 (ref 5.0–7.5)

## 2022-04-20 ENCOUNTER — Encounter: Payer: Self-pay | Admitting: Urology

## 2022-04-20 NOTE — Progress Notes (Signed)
04/16/2022 7:09 AM   Beth Randall Oct 28, 1923 [id number]  Referring provider: Baxter Hire, MD [place],  [place] [address]  Chief Complaint  Patient presents with   Urinary Frequency    HPI: Beth Randall is a 88 y.o. female referred for evaluation of recurrent UTI.  Her son and caregiver were present.  Alzheimer's dementia and wheelchair confined Urinary incontinence and has had several cultures positive for E. coli and Proteus.  She has been treated with antibiotics but no real changes in her behavior voiding pattern. No history of mental status changes which improved with antibiotic therapy   PMH: Past Medical History:  Diagnosis Date   Breast cancer ([place])    Dementia ([place])     Surgical History: Past Surgical History:  Procedure Laterality Date   BACK SURGERY     MASTECTOMY Left     Home Medications:  Allergies as of 04/16/2022       Reactions   Ibuprofen Rash   Tongue swelling   Aspirin    Penicillins    .Has patient had a PCN reaction causing immediate rash, facial/tongue/throat swelling, SOB or lightheadedness with hypotension: Unknown Has patient had a PCN reaction causing severe rash involving mucus membranes or skin necrosis: Unknown Has patient had a PCN reaction that required hospitalization: Unknown Has patient had a PCN reaction occurring within the last 10 years: Unknown If all of the above answers are "NO", then may proceed with Cephalosporin use.        Medication List        Accurate as of April 16, 2022 11:59 PM. If you have any questions, ask your nurse or doctor.          fluticasone 50 MCG/ACT nasal spray Commonly known as: FLONASE Place 2 sprays into both nostrils daily.   loratadine 10 MG tablet Commonly known as: Claritin Take 1 tablet (10 mg total) by mouth daily.   memantine 10 MG tablet Commonly known as: NAMENDA Take 1 tablet by mouth 1 day or 1 dose.   Namzaric 28-10 MG  Cp24 Generic drug: Memantine HCl-Donepezil HCl Take 1 capsule by mouth daily.   sertraline 50 MG tablet Commonly known as: ZOLOFT Take 75 mg by mouth daily.   sulfacetamide 10 % ophthalmic solution Commonly known as: BLEPH-10 Place 1 drop into both eyes 4 (four) times daily.        Allergies:  Allergies  Allergen Reactions   Ibuprofen Rash    Tongue swelling   Aspirin    Penicillins     .Has patient had a PCN reaction causing immediate rash, facial/tongue/throat swelling, SOB or lightheadedness with hypotension: Unknown Has patient had a PCN reaction causing severe rash involving mucus membranes or skin necrosis: Unknown Has patient had a PCN reaction that required hospitalization: Unknown Has patient had a PCN reaction occurring within the last 10 years: Unknown If all of the above answers are "NO", then may proceed with Cephalosporin use.     Family History: Family History  Problem Relation Age of Onset   Hypertension Mother     Social History:  reports that she has never smoked. She has never used smokeless tobacco. She reports that she does not drink alcohol and does not use drugs.   Physical Exam: BP 100/70   Pulse 68   Ht '5\' 5"'$  (1.651 m)   Wt 100 lb (45.4 kg)   BMI 16.64 kg/m   Constitutional:  Alert, no acute distress. HEENT:  [place] AT Respiratory: Normal respiratory effort, no increased work of breathing.   Assessment & Plan:   88 y.o. female with Alzheimer's dementia.  Wheelchair confined and incontinent of urine Several positive cultures for Proteus and E. Coli In speaking with her son and caregiver and she has no real symptoms indicative of a symptomatic UTI and this most likely represents asymptomatic bacteriuria We discussed the treatment of asymptomatic bacteriuria is not recommended Since she has had urine cultures positive for Proteus will schedule a renal ultrasound to evaluate for the possibility of stone disease Order placed and will call with  results   Abbie Sons, [place] [address] [place], [place] [address] [phone]

## 2022-04-25 ENCOUNTER — Ambulatory Visit
Admission: RE | Admit: 2022-04-25 | Discharge: 2022-04-25 | Disposition: A | Discharge status: Home / Self Care | Payer: Medicare HMO | Source: Ambulatory Visit | Attending: Urology | Admitting: Urology

## 2022-04-25 DIAGNOSIS — R3981 Functional urinary incontinence: Secondary | ICD-10-CM | POA: Insufficient documentation

## 2022-04-25 DIAGNOSIS — R8271 Bacteriuria: Secondary | ICD-10-CM | POA: Insufficient documentation

## 2022-04-25 DIAGNOSIS — N3289 Other specified disorders of bladder: Secondary | ICD-10-CM | POA: Diagnosis not present

## 2022-04-30 ENCOUNTER — Telehealth: Payer: Self-pay | Admitting: Family Medicine

## 2022-04-30 NOTE — Telephone Encounter (Signed)
-----   Message from Abbie Sons, MD sent at 04/29/2022 12:03 PM EST ----- Renal ultrasound showed no renal calculi.  Patient with dementia.  Please give report to son

## 2022-04-30 NOTE — Telephone Encounter (Signed)
Patient's son notified and voiced understanding.

## 2022-05-26 ENCOUNTER — Other Ambulatory Visit: Payer: Self-pay

## 2022-05-26 ENCOUNTER — Emergency Department: Payer: Medicare HMO

## 2022-05-26 ENCOUNTER — Emergency Department
Admission: EM | Admit: 2022-05-26 | Discharge: 2022-05-26 | Disposition: A | Discharge status: Home / Self Care | Payer: Medicare HMO | Attending: Emergency Medicine | Admitting: Emergency Medicine

## 2022-05-26 DIAGNOSIS — R059 Cough, unspecified: Secondary | ICD-10-CM | POA: Insufficient documentation

## 2022-05-26 DIAGNOSIS — B338 Other specified viral diseases: Secondary | ICD-10-CM | POA: Insufficient documentation

## 2022-05-26 DIAGNOSIS — Z1152 Encounter for screening for COVID-19: Secondary | ICD-10-CM | POA: Insufficient documentation

## 2022-05-26 DIAGNOSIS — R079 Chest pain, unspecified: Secondary | ICD-10-CM | POA: Diagnosis not present

## 2022-05-26 DIAGNOSIS — R0602 Shortness of breath: Secondary | ICD-10-CM | POA: Insufficient documentation

## 2022-05-26 DIAGNOSIS — R9431 Abnormal electrocardiogram [ECG] [EKG]: Secondary | ICD-10-CM | POA: Diagnosis not present

## 2022-05-26 LAB — BASIC METABOLIC PANEL
Anion gap: 7 (ref 5–15)
BUN: 30 mg/dL — ABNORMAL HIGH (ref 8–23)
CO2: 26 mmol/L (ref 22–32)
Calcium: 9 mg/dL (ref 8.9–10.3)
Chloride: 103 mmol/L (ref 98–111)
Creatinine, Ser: 0.64 mg/dL (ref 0.44–1.00)
GFR, Estimated: >60 mL/min (ref >60)
Glucose, Bld: 102 mg/dL — ABNORMAL HIGH (ref 70–99)
Potassium: 4.1 mmol/L (ref 3.5–5.1)
Sodium: 136 mmol/L (ref 135–145)

## 2022-05-26 LAB — RESP PANEL BY RT-PCR (RSV, FLU A&B, COVID)  RVPGX2
Influenza A by PCR: NEGATIVE
Influenza B by PCR: NEGATIVE
Resp Syncytial Virus by PCR: POSITIVE — AB
SARS Coronavirus 2 by RT PCR: NEGATIVE

## 2022-05-26 LAB — CBC
HCT: 40.7 % (ref 36.0–46.0)
Hemoglobin: 12.8 g/dL (ref 12.0–15.0)
MCH: 29.6 pg (ref 26.0–34.0)
MCHC: 31.4 g/dL (ref 30.0–36.0)
MCV: 94.2 fL (ref 80.0–100.0)
Platelets: 194 10*3/uL (ref 150–400)
RBC: 4.32 MIL/uL (ref 3.87–5.11)
RDW: 13.3 % (ref 11.5–15.5)
WBC: 9.6 10*3/uL (ref 4.0–10.5)
nRBC: 0 % (ref 0.0–0.2)

## 2022-05-26 LAB — TROPONIN I (HIGH SENSITIVITY)
Troponin I (High Sensitivity): 8 ng/L (ref <18)
Troponin I (High Sensitivity): 9 ng/L (ref <18)

## 2022-05-26 MED ORDER — BENZONATATE 100 MG PO CAPS: 100.0000 mg | ORAL_CAPSULE | Freq: Three times a day (TID) | ORAL | 0 refills | Status: DC | PRN

## 2022-05-26 NOTE — Discharge Instructions (Signed)
Please seek medical attention for any high fevers, chest pain, shortness of breath, change in behavior, persistent vomiting, bloody stool or any other new or concerning symptoms.  

## 2022-05-26 NOTE — ED Provider Triage Note (Signed)
Emergency Medicine Provider Triage Evaluation Note  Beth Randall , a 88 y.o. female  was evaluated in triage.  Pt complains of cough and congestion.  Per daughter her doctor put her on albuterol last 4 days.  Also taking mucinex.    Review of Systems  Positive: Cough, congestion.  Hx of dementia Negative: No fever, chills per family  Physical Exam  There were no vitals taken for this visit. Gen:   Awake, no distress   Resp:  Normal effort diminished breath sounds.  MSK:   Moves with assistance.   Other:    Medical Decision Making  Medically screening exam initiated at 2:56 PM.  Appropriate orders placed.  LUNDEN STIEBER was informed that the remainder of the evaluation will be completed by another provider, this initial triage assessment does not replace that evaluation, and the importance of remaining in the ED until their evaluation is complete.     Johnn Hai, PA-C 05/26/22 1459

## 2022-05-26 NOTE — ED Provider Notes (Signed)
[facility] Provider Note    Event Date/Time   First MD Initiated Contact with Patient 05/26/22 2038     (approximate)   History   Cough   HPI  Beth Randall is a 88 y.o. female  brought in by family because of concern for cough. Family states that the patient has had issues with cough and congestion for about a year but it has gotten worse recently. The family has been trying mucinex as well as an albuterol inhaler. They feel she has gotten some relief with the inhaler. She has not had any fevers. Family states that blood pressure does tend to run low and it is not unusual for her pressure to be in the 90s.        Physical Exam   Triage Vital Signs: ED Triage Vitals  Enc Vitals Group     BP 05/26/22 1454 (!) 97/55     Pulse Rate 05/26/22 1454 80     Resp 05/26/22 1454 18     Temp 05/26/22 1457 (!) 96.1 F (35.6 C)     Temp Source 05/26/22 1457 Axillary     SpO2 05/26/22 1454 95 %     Weight --      Height --      Head Circumference --      Peak Flow --      Pain Score --      Pain Loc --      Pain Edu? --      Excl. in [place]? --     Most recent vital signs: Vitals:   05/26/22 2048 05/26/22 2051  BP: 123/60   Pulse: 83   Resp: 16   Temp:    SpO2: 94% 96%   General: Awake, alert.  CV:  Good peripheral perfusion. Regular rate and rhythm. Resp:  Normal effort. Lungs clear. Abd:  No distention.   ED Results / Procedures / Treatments   Labs (all labs ordered are listed, but only abnormal results are displayed) Labs Reviewed  RESP PANEL BY RT-PCR (RSV, FLU A&B, COVID)  RVPGX2 - Abnormal; Notable for the following components:      Result Value   Resp Syncytial Virus by PCR POSITIVE (*)    All other components within normal limits  BASIC METABOLIC PANEL - Abnormal; Notable for the following components:   Glucose, Bld 102 (*)    BUN 30 (*)    All other components within normal limits  CBC  TROPONIN I (HIGH SENSITIVITY)  TROPONIN  I (HIGH SENSITIVITY)     EKG  I, Nance Pear, attending physician, personally viewed and interpreted this EKG  EKG Time: 1503 Rate: 79 Rhythm: sinus rhythm with 1st degree av block Axis: left axis deviation Intervals: qtc 481 QRS: narrow, q waves II, III, aVF, V3 ST changes: no st elevation Impression: abnormal ekg    RADIOLOGY I independently interpreted and visualized the CXR. My interpretation: No pneumonia. Radiology interpretation:  IMPRESSION:  Blunting of left lateral CP angle suggests small effusion or pleural  thickening. Small linear densities in left lower lung fields may  suggest scarring or subsegmental atelectasis.     PROCEDURES:  Critical Care performed: No  Procedures   MEDICATIONS ORDERED IN ED: Medications - No data to display   IMPRESSION / MDM / [place] / ED COURSE  I reviewed the triage vital signs and the nursing notes.  Differential diagnosis includes, but is not limited to, pneumonia, COVID, influenza, viral URI.  Patient's presentation is most consistent with acute presentation with potential threat to life or bodily function.  Patient presents to the emergency department today because of concern for worsening cough. CXR without pneumonia. Patient did test positive for RSV. Blood pressure on the low side here but per family it is consistent with patient's baseline and per chart review she has had many office visits with similar blood pressures. At this time do think patient's symptoms likely due to the RSV. Do not feel antibiotics are warranted given lack of fever, leukocytosis or PNA on x-ray. Discussed findings with patient's family. Will discharge with prescription for cough medication.   FINAL CLINICAL IMPRESSION(S) / ED DIAGNOSES   Final diagnoses:  Cough, unspecified type  RSV infection     Note:  This document was prepared using Dragon voice recognition software and may include  unintentional dictation errors.    Nance Pear, MD 05/26/22 2107

## 2022-05-26 NOTE — ED Triage Notes (Signed)
Pt presents via POV c/o cough and congestion. Daughter reports pt has chronic cough and congestion however it has worsened. Denies fevers at home.

## 2022-05-31 ENCOUNTER — Other Ambulatory Visit: Payer: Self-pay

## 2022-05-31 ENCOUNTER — Emergency Department: Payer: Medicare HMO

## 2022-05-31 ENCOUNTER — Inpatient Hospital Stay
Admission: EM | Admit: 2022-05-31 | Discharge: 2022-06-08 | DRG: 871 | Disposition: A | Discharge status: Hospice | Payer: Medicare HMO | Attending: Internal Medicine | Admitting: Internal Medicine

## 2022-05-31 DIAGNOSIS — Z886 Allergy status to analgesic agent status: Secondary | ICD-10-CM | POA: Diagnosis not present

## 2022-05-31 DIAGNOSIS — Z8249 Family history of ischemic heart disease and other diseases of the circulatory system: Secondary | ICD-10-CM

## 2022-05-31 DIAGNOSIS — R627 Adult failure to thrive: Secondary | ICD-10-CM | POA: Diagnosis present

## 2022-05-31 DIAGNOSIS — Z7401 Bed confinement status: Secondary | ICD-10-CM | POA: Diagnosis not present

## 2022-05-31 DIAGNOSIS — Z880 Allergy status to penicillin: Secondary | ICD-10-CM

## 2022-05-31 DIAGNOSIS — F32A Depression, unspecified: Secondary | ICD-10-CM | POA: Insufficient documentation

## 2022-05-31 DIAGNOSIS — J210 Acute bronchiolitis due to respiratory syncytial virus: Secondary | ICD-10-CM

## 2022-05-31 DIAGNOSIS — G309 Alzheimer's disease, unspecified: Secondary | ICD-10-CM | POA: Diagnosis present

## 2022-05-31 DIAGNOSIS — D649 Anemia, unspecified: Secondary | ICD-10-CM | POA: Diagnosis present

## 2022-05-31 DIAGNOSIS — R6520 Severe sepsis without septic shock: Secondary | ICD-10-CM | POA: Diagnosis present

## 2022-05-31 DIAGNOSIS — J189 Pneumonia, unspecified organism: Secondary | ICD-10-CM | POA: Diagnosis not present

## 2022-05-31 DIAGNOSIS — R059 Cough, unspecified: Secondary | ICD-10-CM | POA: Diagnosis not present

## 2022-05-31 DIAGNOSIS — Z993 Dependence on wheelchair: Secondary | ICD-10-CM | POA: Diagnosis not present

## 2022-05-31 DIAGNOSIS — J9601 Acute respiratory failure with hypoxia: Secondary | ICD-10-CM | POA: Diagnosis not present

## 2022-05-31 DIAGNOSIS — Z853 Personal history of malignant neoplasm of breast: Secondary | ICD-10-CM

## 2022-05-31 DIAGNOSIS — Z515 Encounter for palliative care: Secondary | ICD-10-CM | POA: Diagnosis not present

## 2022-05-31 DIAGNOSIS — Z79899 Other long term (current) drug therapy: Secondary | ICD-10-CM

## 2022-05-31 DIAGNOSIS — R0902 Hypoxemia: Secondary | ICD-10-CM | POA: Diagnosis not present

## 2022-05-31 DIAGNOSIS — Z66 Do not resuscitate: Secondary | ICD-10-CM | POA: Diagnosis present

## 2022-05-31 DIAGNOSIS — Z23 Encounter for immunization: Secondary | ICD-10-CM | POA: Diagnosis not present

## 2022-05-31 DIAGNOSIS — E871 Hypo-osmolality and hyponatremia: Secondary | ICD-10-CM | POA: Diagnosis present

## 2022-05-31 DIAGNOSIS — I440 Atrioventricular block, first degree: Secondary | ICD-10-CM | POA: Diagnosis present

## 2022-05-31 DIAGNOSIS — Z1152 Encounter for screening for COVID-19: Secondary | ICD-10-CM | POA: Diagnosis not present

## 2022-05-31 DIAGNOSIS — Z743 Need for continuous supervision: Secondary | ICD-10-CM | POA: Diagnosis not present

## 2022-05-31 DIAGNOSIS — R5381 Other malaise: Secondary | ICD-10-CM | POA: Diagnosis present

## 2022-05-31 DIAGNOSIS — Z9012 Acquired absence of left breast and nipple: Secondary | ICD-10-CM | POA: Diagnosis not present

## 2022-05-31 DIAGNOSIS — F0390 Unspecified dementia without behavioral disturbance: Secondary | ICD-10-CM | POA: Diagnosis not present

## 2022-05-31 DIAGNOSIS — A419 Sepsis, unspecified organism: Principal | ICD-10-CM

## 2022-05-31 DIAGNOSIS — R4182 Altered mental status, unspecified: Secondary | ICD-10-CM | POA: Diagnosis not present

## 2022-05-31 DIAGNOSIS — Z7189 Other specified counseling: Secondary | ICD-10-CM | POA: Diagnosis not present

## 2022-05-31 DIAGNOSIS — E876 Hypokalemia: Secondary | ICD-10-CM | POA: Diagnosis present

## 2022-05-31 DIAGNOSIS — R531 Weakness: Secondary | ICD-10-CM | POA: Diagnosis not present

## 2022-05-31 DIAGNOSIS — G319 Degenerative disease of nervous system, unspecified: Secondary | ICD-10-CM | POA: Diagnosis not present

## 2022-05-31 DIAGNOSIS — J90 Pleural effusion, not elsewhere classified: Secondary | ICD-10-CM | POA: Diagnosis not present

## 2022-05-31 DIAGNOSIS — F0283 Dementia in other diseases classified elsewhere, unspecified severity, with mood disturbance: Secondary | ICD-10-CM | POA: Diagnosis present

## 2022-05-31 DIAGNOSIS — R1310 Dysphagia, unspecified: Secondary | ICD-10-CM | POA: Diagnosis present

## 2022-05-31 DIAGNOSIS — G9341 Metabolic encephalopathy: Secondary | ICD-10-CM | POA: Diagnosis not present

## 2022-05-31 LAB — BASIC METABOLIC PANEL
Anion gap: 12 (ref 5–15)
BUN: 26 mg/dL — ABNORMAL HIGH (ref 8–23)
CO2: 20 mmol/L — ABNORMAL LOW (ref 22–32)
Calcium: 8.6 mg/dL — ABNORMAL LOW (ref 8.9–10.3)
Chloride: 99 mmol/L (ref 98–111)
Creatinine, Ser: 0.54 mg/dL (ref 0.44–1.00)
GFR, Estimated: >60 mL/min (ref >60)
Glucose, Bld: 103 mg/dL — ABNORMAL HIGH (ref 70–99)
Potassium: 3.6 mmol/L (ref 3.5–5.1)
Sodium: 131 mmol/L — ABNORMAL LOW (ref 135–145)

## 2022-05-31 LAB — CBC
HCT: 35.8 % — ABNORMAL LOW (ref 36.0–46.0)
Hemoglobin: 11.6 g/dL — ABNORMAL LOW (ref 12.0–15.0)
MCH: 29.4 pg (ref 26.0–34.0)
MCHC: 32.4 g/dL (ref 30.0–36.0)
MCV: 90.6 fL (ref 80.0–100.0)
Platelets: 225 10*3/uL (ref 150–400)
RBC: 3.95 MIL/uL (ref 3.87–5.11)
RDW: 13.3 % (ref 11.5–15.5)
WBC: 16.1 10*3/uL — ABNORMAL HIGH (ref 4.0–10.5)
nRBC: 0 % (ref 0.0–0.2)

## 2022-05-31 LAB — TROPONIN I (HIGH SENSITIVITY): Troponin I (High Sensitivity): 18 ng/L — ABNORMAL HIGH (ref <18)

## 2022-05-31 MED ORDER — SERTRALINE HCL 50 MG PO TABS
75.0000 mg | ORAL_TABLET | Freq: Every day | ORAL | Status: DC
  Administered 2022-06-01 – 2022-06-05 (×4): 75 mg via ORAL
  Filled 2022-05-31 (×5): qty 2

## 2022-05-31 MED ORDER — IPRATROPIUM-ALBUTEROL 0.5-2.5 (3) MG/3ML IN SOLN
3.0000 mL | Freq: Once | RESPIRATORY_TRACT | Status: AC
  Administered 2022-05-31: 3 mL via RESPIRATORY_TRACT
  Filled 2022-05-31: qty 3

## 2022-05-31 MED ORDER — DONEPEZIL HCL 5 MG PO TABS
10.0000 mg | ORAL_TABLET | Freq: Every day | ORAL | Status: DC
  Administered 2022-06-03 – 2022-06-08 (×6): 10 mg via ORAL
  Filled 2022-05-31 (×8): qty 2

## 2022-05-31 MED ORDER — MAGNESIUM HYDROXIDE 400 MG/5ML PO SUSP: 30.0000 mL | Freq: Every day | ORAL | Status: DC | PRN

## 2022-05-31 MED ORDER — TRAZODONE HCL 50 MG PO TABS: 25.0000 mg | ORAL_TABLET | Freq: Every evening | ORAL | Status: DC | PRN

## 2022-05-31 MED ORDER — ACETAMINOPHEN 325 MG PO TABS: 650.0000 mg | ORAL_TABLET | Freq: Four times a day (QID) | ORAL | Status: DC | PRN

## 2022-05-31 MED ORDER — ENOXAPARIN SODIUM 30 MG/0.3ML IJ SOSY
30.0000 mg | PREFILLED_SYRINGE | INTRAMUSCULAR | Status: DC
  Administered 2022-06-01: 30 mg via SUBCUTANEOUS
  Filled 2022-05-31: qty 0.3

## 2022-05-31 MED ORDER — ONDANSETRON HCL 4 MG/2ML IJ SOLN: 4.0000 mg | Freq: Four times a day (QID) | INTRAMUSCULAR | Status: DC | PRN

## 2022-05-31 MED ORDER — SODIUM CHLORIDE 0.9 % IV BOLUS (SEPSIS)
500.0000 mL | Freq: Once | INTRAVENOUS | Status: AC
  Administered 2022-05-31: 500 mL via INTRAVENOUS

## 2022-05-31 MED ORDER — SULFACETAMIDE SODIUM 10 % OP SOLN: 1.0000 [drp] | Freq: Four times a day (QID) | OPHTHALMIC | Status: DC

## 2022-05-31 MED ORDER — MEMANTINE HCL 5 MG PO TABS: 10.0000 mg | ORAL_TABLET | ORAL | Status: DC

## 2022-05-31 MED ORDER — BENZONATATE 100 MG PO CAPS
100.0000 mg | ORAL_CAPSULE | Freq: Three times a day (TID) | ORAL | Status: DC | PRN
  Administered 2022-06-01 – 2022-06-03 (×2): 100 mg via ORAL
  Filled 2022-05-31 (×2): qty 1

## 2022-05-31 MED ORDER — ONDANSETRON HCL 4 MG PO TABS: 4.0000 mg | ORAL_TABLET | Freq: Four times a day (QID) | ORAL | Status: DC | PRN

## 2022-05-31 MED ORDER — MEMANTINE HCL ER 28 MG PO CP24
28.0000 mg | ORAL_CAPSULE | Freq: Every day | ORAL | Status: DC
  Administered 2022-06-03 – 2022-06-08 (×6): 28 mg via ORAL
  Filled 2022-05-31 (×7): qty 1

## 2022-05-31 MED ORDER — FLUTICASONE PROPIONATE 50 MCG/ACT NA SUSP: 2.0000 | Freq: Every day | NASAL | Status: DC | PRN

## 2022-05-31 MED ORDER — ACETAMINOPHEN 650 MG RE SUPP: 650.0000 mg | Freq: Four times a day (QID) | RECTAL | Status: DC | PRN

## 2022-05-31 MED ORDER — LORATADINE 10 MG PO TABS
10.0000 mg | ORAL_TABLET | Freq: Every day | ORAL | Status: DC
  Administered 2022-06-01 – 2022-06-08 (×7): 10 mg via ORAL
  Filled 2022-05-31 (×8): qty 1

## 2022-05-31 MED ORDER — LEVOFLOXACIN IN D5W 750 MG/150ML IV SOLN
750.0000 mg | Freq: Once | INTRAVENOUS | Status: DC
  Filled 2022-05-31: qty 150

## 2022-05-31 MED ORDER — LEVOFLOXACIN IN D5W 750 MG/150ML IV SOLN: 750.0000 mg | Freq: Once | INTRAVENOUS | Status: DC

## 2022-05-31 MED ORDER — MEMANTINE HCL-DONEPEZIL HCL ER 28-10 MG PO CP24: 1.0000 | ORAL_CAPSULE | Freq: Every day | ORAL | Status: DC

## 2022-05-31 MED ORDER — SODIUM CHLORIDE 0.9 % IV SOLN: INTRAVENOUS | Status: DC

## 2022-05-31 NOTE — ED Provider Triage Note (Signed)
Emergency Medicine Provider Triage Evaluation Note  Beth Randall , a 88 y.o. female  was evaluated in triage.  Pt complains of hypoxia, sent by [facility] due to low 02, doesn't wear 02 at home, hx alz/.  Review of Systems  Positive:  Negative:   Physical Exam  Ht '5\' 5"'$  (1.651 m)   Wt 45.4 kg   BMI 16.66 kg/m  Gen:   Awake, no distress  Resp:  Normal effort  MSK:   Moves extremities without difficulty  Other:  Sitting in wc,  Medical Decision Making  Medically screening exam initiated at 5:06 PM.  Appropriate orders placed.  Beth Randall was informed that the remainder of the evaluation will be completed by another provider, this initial triage assessment does not replace that evaluation, and the importance of remaining in the ED until their evaluation is complete.     Versie Starks, PA-C 05/31/22 1707

## 2022-05-31 NOTE — Progress Notes (Signed)
CODE SEPSIS - PHARMACY COMMUNICATION  **Broad Spectrum Antibiotics should be administered within 1 hour of Sepsis diagnosis**  Time Code Sepsis Called/Page Received: 2223  Antibiotics Ordered: Levaquin transitioned to Ceftriaxone & Azithromycin, delaying administration time.  Time of 1st antibiotic administration: [place], PharmD, [facility] 05/31/2022 10:22 PM

## 2022-05-31 NOTE — Progress Notes (Signed)
PHARMACIST - PHYSICIAN COMMUNICATION  CONCERNING:  Enoxaparin (Lovenox) for DVT Prophylaxis    RECOMMENDATION: Patient was prescribed enoxaprin '40mg'$  q24 hours for VTE prophylaxis.   Filed Weights   05/31/22 1637  Weight: 45.4 kg (100 lb 1.4 oz)    Body mass index is 16.66 kg/m.  Estimated Creatinine Clearance: 29.5 mL/min (by C-G formula based on SCr of 0.54 mg/dL).  Patient is candidate for enoxaparin '30mg'$  every 24 hours based on CrCl <31m/min or Weight <45kg  DESCRIPTION: Pharm[facility].  Patient is now receiving enoxaparin 30 mg every 24 hours   NRenda Rolls PharmD, [facility]12/28/2023 11:12 PM

## 2022-05-31 NOTE — ED Provider Notes (Signed)
[facility] Provider Note    Event Date/Time   First MD Initiated Contact with Patient 05/31/22 2220     (approximate)  History   Chief Complaint: hypoxia  HPI  Beth Randall is a 88 y.o. female with a past medical history of dementia, presents to the emergency department from home for worsening cough congestion and shortness of breath.  According to the family (and my record review) patient was seen in the emergency department on 12/23 diagnosed with RSV.  At that time patient was ultimately discharged home as there were no other concerning findings and the patient had no hypoxia.  Chest x-ray was clear.  Family states the patient's cough worsened over the weekend however she seemed to be improving Monday and Tuesday but over the past 2 days has been getting much more short of breath with a wet sounding cough.  Physical Exam   Triage Vital Signs: ED Triage Vitals  Enc Vitals Group     BP 05/31/22 1706 104/60     Pulse Rate 05/31/22 1706 73     Resp 05/31/22 1706 18     Temp 05/31/22 1706 97.9 F (36.6 C)     Temp Source 05/31/22 1706 Oral     SpO2 05/31/22 1706 (!) 89 %     Weight 05/31/22 1637 100 lb 1.4 oz (45.4 kg)     Height 05/31/22 1637 '5\' 5"'$  (1.651 m)     Head Circumference --      Peak Flow --      Pain Score 05/31/22 1637 0     Pain Loc --      Pain Edu? --      Excl. in [place]? --     Most recent vital signs: Vitals:   05/31/22 2033 05/31/22 2200  BP: 112/66   Pulse: 80   Resp: 16   Temp: (!) 97.4 F (36.3 C)   SpO2: 92% 92%    General: Somnolent.  Will look at you when you talk but is not currently answering questions.  Has a history of dementia. CV:  Good peripheral perfusion.  Regular rate and rhythm  Resp:  Patient with a very wet sounding frequent cough. Abd:  No distention.  Soft, nontender.  No rebound or guarding. Other:  Minimal lower extremity edema.   ED Results / Procedures / Treatments   EKG  EKG viewed and  interpreted by myself shows normal sinus rhythm at 79 bpm with a narrow QRS, left axis deviation, slight PR prolongation otherwise normal intervals nonspecific ST changes without ST elevation.  RADIOLOGY  I have reviewed and interpreted the chest x-ray images.  Patient appears to have several hazy areas bilaterally. Radiology has read the chest x-ray is interval development of multifocal airspace disease consistent with multifocal pneumonia.   MEDICATIONS ORDERED IN ED: Medications  sodium chloride 0.9 % bolus 500 mL (has no administration in time range)  levofloxacin (LEVAQUIN) IVPB 750 mg (has no administration in time range)     IMPRESSION / MDM / ASSESSMENT AND PLAN / ED COURSE  I reviewed the triage vital signs and the nursing notes.  Patient's presentation is most consistent with acute presentation with potential threat to life or bodily function.  Patient presents emergency department for worsening shortness of breath wet sounding cough congestion now hypoxic in the 80s on room air.  No baseline O2 requirement.  Patient placed on 2 L now increased to 4 L via nasal cannula currently satting  in the low 90s on 4 L.  Very wet sounding cough we will dose a DuoNeb to see if we can loosen up the patient's secretions and help her oxygenate a little better.  Patient's chest x-ray although clear on the 23rd now shows multifocal pneumonia.  Patient's white blood cell count is elevated to 16,000 concerning for possible bacterial superinfection in addition to the RSV infection.  Will cover with broad-spectrum antibiotics and I have added a procalcitonin to the patient.  Patient's chemistry shows no significant abnormality, troponin reassuringly is 18 unlikely related to ACS.  Patient will require admission to the hospital service for further workup and treatment.  I discussed with the son, he states the patient would not want to be intubated or resuscitated.  I placed a DNR order to abide by the  patient's wishes.  CRITICAL CARE Performed by: Harvest Dark   Total critical care time: 30 minutes  Critical care time was exclusive of separately billable procedures and treating other patients.  Critical care was necessary to treat or prevent imminent or life-threatening deterioration.  Critical care was time spent personally by me on the following activities: development of treatment plan with patient and/or surrogate as well as nursing, discussions with consultants, evaluation of patient's response to treatment, examination of patient, obtaining history from patient or surrogate, ordering and performing treatments and interventions, ordering and review of laboratory studies, ordering and review of radiographic studies, pulse oximetry and re-evaluation of patient's condition.   FINAL CLINICAL IMPRESSION(S) / ED DIAGNOSES   Multifocal pneumonia Respiratory failure RSV   Note:  This document was prepared using Dragon voice recognition software and may include unintentional dictation errors.   Harvest Dark, MD 05/31/22 2246

## 2022-05-31 NOTE — ED Triage Notes (Signed)
Pt oxygen 88% on RA. Pt placed on oxygen

## 2022-05-31 NOTE — Sepsis Progress Note (Signed)
Elink monitoring for the code sepsis protocol.  

## 2022-05-31 NOTE — H&P (Addendum)
[place]   PATIENT NAME: Beth Randall    MR#:  [id number]  DATE OF BIRTH:  April 06, 1924  DATE OF ADMISSION:  05/31/2022  PRIMARY CARE PHYSICIAN: Baxter Hire, MD   Patient is coming from: Home  REQUESTING/REFERRING PHYSICIAN: Harvest Dark, MD  CHIEF COMPLAINT:   Chief Complaint  Patient presents with   hypoxia    HISTORY OF PRESENT ILLNESS:  Beth Randall is a 88 y.o. Caucasian female with medical history significant for dementia and breast cancer status post mastectomy, who presented to the emergency room with a [place] of worsening dyspnea with associated cough since Thursday which has been initially dry and later congested with a rasp.  She has been having wheezing and dyspnea as well as chills with tactile fever and diaphoresis.  No nausea or vomiting but she had loose bowel movements.  No dysuria, oliguria or hematuria or flank pain.  No chest pain or palpitations.  She has 24-hour care and needs help with her ADLs.  She is not on home oxygen and was found hypoxic in the mid 80s on room air requiring 2 L initially with adequate pulse oximetry and later on it dropped to 89% and it was increased to 4 L.  ED Course: When the patient came to the ER, sodium was 131 CO2 20 with glucose of 103 and BUN 26, calcium 8.6.  High sensitive troponin I was 18 and later 27 and lactic acid 1.3.  CBC showed leukocytosis 16.1 with mild anemia.  RSV was positive on 12/23 when she came to the ER at which time her chest x-ray was ordered showing only suspected left small pleural effusion and likely left basilar atelectasis and she was managed and discharged.  Influenza antigens A&B and COVID-19 PCR were negative then and are currently negative.  RSV is still positive.  EKG as reviewed by me : EKG showed sinus rhythm with a rate of 79 with first-degree AV block low voltage QRS with T wave inversion anterolaterally Imaging:Portable chest x-ray showed interval development of  multifocal bilateral airspace disease, greatest at the lung bases is consistent with bilateral pneumonia, small left pleural effusion increased since prior study.  The patient was given DuoNebs and IV Levaquin.  She will be admitted to a medical telemetry bed for further evaluation and management. PAST MEDICAL HISTORY:   Past Medical History:  Diagnosis Date   Breast cancer ([place])    Dementia ([place])     PAST SURGICAL HISTORY:   Past Surgical History:  Procedure Laterality Date   BACK SURGERY     MASTECTOMY Left     SOCIAL HISTORY:   Social History   Tobacco Use   Smoking status: Never   Smokeless tobacco: Never  Substance Use Topics   Alcohol use: No    FAMILY HISTORY:   Family History  Problem Relation Age of Onset   Hypertension Mother     DRUG ALLERGIES:   Allergies  Allergen Reactions   Ibuprofen Rash    Tongue swelling   Aspirin    Penicillins     .Has patient had a PCN reaction causing immediate rash, facial/tongue/throat swelling, SOB or lightheadedness with hypotension: Unknown Has patient had a PCN reaction causing severe rash involving mucus membranes or skin necrosis: Unknown Has patient had a PCN reaction that required hospitalization: Unknown Has patient had a PCN reaction occurring within the last 10 years: Unknown If all of the above answers are "NO", then may proceed with  Cephalosporin use.     REVIEW OF SYSTEMS:   ROS As per history of present illness. All pertinent systems were reviewed above. Constitutional, HEENT, cardiovascular, respiratory, GI, GU, musculoskeletal, neuro, psychiatric, endocrine, integumentary and hematologic systems were reviewed and are otherwise negative/unremarkable except for positive findings mentioned above in the HPI.   MEDICATIONS AT HOME:   Prior to Admission medications   Medication Sig Start Date End Date Taking? Authorizing Provider  benzonatate (TESSALON PERLES) 100 MG capsule Take 1 capsule (100 mg  total) by mouth 3 (three) times daily as needed for cough. 05/26/22 05/26/23  Nance Pear, MD  fluticasone (FLONASE) 50 MCG/ACT nasal spray Place 2 sprays into both nostrils daily. 09/02/21   [provider]  loratadine (CLARITIN) 10 MG tablet Take 1 tablet (10 mg total) by mouth daily. 04/28/18 04/28/19  Lavonia Drafts, MD  memantine (NAMENDA) 10 MG tablet Take 1 tablet by mouth 1 day or 1 dose.    [provider]  NAMZARIC 28-10 MG CP24 Take 1 capsule by mouth daily. 07/27/21   [provider]  sertraline (ZOLOFT) 50 MG tablet Take 75 mg by mouth daily. 11/18/16   [provider]  sulfacetamide (BLEPH-10) 10 % ophthalmic solution Place 1 drop into both eyes 4 (four) times daily. 12/20/21   [provider]      VITAL SIGNS:  Blood pressure 112/66, pulse 80, temperature (!) 97.4 F (36.3 C), temperature source Axillary, resp. rate 16, height '5\' 5"'$  (1.651 m), weight 45.4 kg, SpO2 92 %.  PHYSICAL EXAMINATION:  Physical Exam  GENERAL:  88 y.o.-year-old Caucasian female patient lying in the bed with no acute distress.  She was somnolent and difficult to arouse. EYES: Pupils equal, round, reactive to light and accommodation. No scleral icterus. Extraocular muscles intact.  HEENT: Head atraumatic, normocephalic. Oropharynx and nasopharynx clear.  NECK:  Supple, no jugular venous distention. No thyroid enlargement, no tenderness.  LUNGS: Diminished bibasilar breath sounds with bibasal crackles. No use of accessory muscles of respiration.  CARDIOVASCULAR: Regular rate and rhythm, S1, S2 normal. No murmurs, rubs, or gallops.  ABDOMEN: Soft, nondistended, nontender. Bowel sounds present. No organomegaly or mass.  EXTREMITIES: No pedal edema, cyanosis, or clubbing.  NEUROLOGIC: No lateralizing signs. PSYCHIATRIC: The patient is somnolent and difficult to arouse. SKIN: No obvious rash, lesion, or ulcer.   LABORATORY PANEL:   CBC Recent Labs  Lab  05/31/22 1828  WBC 16.1*  HGB 11.6*  HCT 35.8*  PLT 225   ------------------------------------------------------------------------------------------------------------------  Chemistries  Recent Labs  Lab 05/31/22 1828  NA 131*  K 3.6  CL 99  CO2 20*  GLUCOSE 103*  BUN 26*  CREATININE 0.54  CALCIUM 8.6*   ------------------------------------------------------------------------------------------------------------------  Cardiac Enzymes No results for input(s): "TROPONINI" in the last 168 hours. ------------------------------------------------------------------------------------------------------------------  RADIOLOGY:  DG Chest Port 1 View  Result Date: 05/31/2022 CLINICAL DATA:  Hypoxia, RSV, cough EXAM: PORTABLE CHEST 1 VIEW COMPARISON:  05/26/2022 FINDINGS: Single frontal view of the chest demonstrates interval development of bilateral interstitial and ground-glass opacities. There are areas of denser consolidation seen in the retrocardiac region and at the right lung base. Small left pleural effusion is suspected. No pneumothorax. Cardiac silhouette is unremarkable. IMPRESSION: 1. Interval development of multifocal bilateral airspace disease, greatest at the lung bases, consistent with bilateral pneumonia. 2. Small left pleural effusion, increased since prior study. Electronically Signed   By: Randa Ngo M.D.   On: 05/31/2022 17:34      IMPRESSION AND PLAN:  Assessment and Plan: * Multifocal pneumonia - The patient will be admitted to a medical telemetry bed. - We will continue antibiotic therapy with IV Levaquin. - Mucolytic therapy will be provided as well as bronchodilator therapy. - We will follow blood cultures.  Sepsis due to pneumonia [facility]) - This is manifested by tachypnea and leukocytosis. - She meets severe sepsis criteria given her associated acute respiratory failure with hypoxia due to her pneumonia - Management as above. - She will be hydrated with  IV normal saline. - We will follow blood cultures.  RSV (acute bronchiolitis due to respiratory syncytial virus) - She was positive for RSV on 12/23 and during this visit. - She likely has secondary bacterial infection.  Dementia without behavioral disturbance ([place]) - We will continue Aricept and Namenda.  Depression - We will continue Zoloft.   DVT prophylaxis: Lovenox.  Advanced Care Planning:  Code Status: full code.  Family Communication:  The plan of care was discussed in details with the patient (and family). I answered all questions. The patient agreed to proceed with the above mentioned plan. Further management will depend upon hospital course. Disposition Plan: Back to previous home environment Consults called: none.  All the records are reviewed and case discussed with ED provider.  Status is: Inpatient   At the time of the admission, it appears that the appropriate admission status for this patient is inpatient.  This is judged to be reasonable and necessary in order to provide the required intensity of service to ensure the patient's safety given the presenting symptoms, physical exam findings and initial radiographic and laboratory data in the context of comorbid conditions.  The patient requires inpatient status due to high intensity of service, high risk of further deterioration and high frequency of surveillance required.  I certify that at the time of admission, it is my clinical judgment that the patient will require inpatient hospital care extending more than 2 midnights.                            Dispo: The patient is from: Home              Anticipated d/c is to: Home              Patient currently is not medically stable to d/c.              Difficult to place patient: No  Christel Mormon M.D on 06/01/2022 at 2:00 AM  [facility]   From 7 PM-7 AM, contact night-coverage [email or web address]  CC: Primary care physician; Baxter Hire, MD

## 2022-05-31 NOTE — ED Triage Notes (Signed)
First Nurse Note:  Arrives from [facility] for ED evaluation.  Patient seen through ED on 12/23 and diagnosed with RSV.   Today at [facility] RA sats 89-91%.  Strong cough observed.  No SOB/ DOE.

## 2022-05-31 NOTE — Progress Notes (Signed)
Pharmacy Antibiotic Note  Beth Randall is a 88 y.o. female admitted on 05/31/2022 with CAP.  Pharmacy has been consulted for Levaquin to Ceftriaxone & Azithromycin dosing for 5 days.  Pt with allergy to PCN documented in 2016.  Pt with hx of tolerating ceftriaxone & cephalexin in 2019.  Plan: Ceftriaxone 2 gm q24hr & Azithromycin 500 mg q24hr per indication & renal fxn.  Pharmacy will continue to follow and will adjust abx dosing whenever warranted.  Temp (24hrs), Avg:97.7 F (36.5 C), Min:97.4 F (36.3 C), Max:97.9 F (36.6 C)   Recent Labs  Lab 05/26/22 1458 05/31/22 1828  WBC 9.6 16.1*  CREATININE 0.64 0.54    Estimated Creatinine Clearance: 29.5 mL/min (by C-G formula based on SCr of 0.54 mg/dL).    Allergies  Allergen Reactions   Ibuprofen Rash    Tongue swelling   Aspirin    Penicillins     .Has patient had a PCN reaction causing immediate rash, facial/tongue/throat swelling, SOB or lightheadedness with hypotension: Unknown Has patient had a PCN reaction causing severe rash involving mucus membranes or skin necrosis: Unknown Has patient had a PCN reaction that required hospitalization: Unknown Has patient had a PCN reaction occurring within the last 10 years: Unknown If all of the above answers are "NO", then may proceed with Cephalosporin use.     Antimicrobials this admission: 12/29 Azithromycin >> x 5 days 12/29 Ceftriaxone >> x 5 days  Microbiology results: 12/28 BCx: Pending  Thank you for allowing pharmacy to be a part of this patient's care.  Renda Rolls, PharmD, [facility] 05/31/2022 11:12 PM

## 2022-05-31 NOTE — ED Triage Notes (Signed)
Lab called for assistance with blood draw

## 2022-05-31 NOTE — ED Notes (Addendum)
Family to desk reporting pt with increasing diff breathing and "choking on something"; O2 sats 92% on O2 at 3l/min via [place]; audible rhales; reviewed pt's xray results; charge nurse notified and will take pt to exam room for further eval

## 2022-06-01 ENCOUNTER — Inpatient Hospital Stay: Payer: Medicare HMO

## 2022-06-01 DIAGNOSIS — J210 Acute bronchiolitis due to respiratory syncytial virus: Secondary | ICD-10-CM

## 2022-06-01 DIAGNOSIS — A419 Sepsis, unspecified organism: Secondary | ICD-10-CM

## 2022-06-01 DIAGNOSIS — J9601 Acute respiratory failure with hypoxia: Principal | ICD-10-CM

## 2022-06-01 DIAGNOSIS — F32A Depression, unspecified: Secondary | ICD-10-CM | POA: Insufficient documentation

## 2022-06-01 DIAGNOSIS — J189 Pneumonia, unspecified organism: Secondary | ICD-10-CM | POA: Diagnosis not present

## 2022-06-01 DIAGNOSIS — F0390 Unspecified dementia without behavioral disturbance: Secondary | ICD-10-CM | POA: Insufficient documentation

## 2022-06-01 LAB — BASIC METABOLIC PANEL
Anion gap: 10 (ref 5–15)
BUN: 21 mg/dL (ref 8–23)
CO2: 22 mmol/L (ref 22–32)
Calcium: 7.9 mg/dL — ABNORMAL LOW (ref 8.9–10.3)
Chloride: 104 mmol/L (ref 98–111)
Creatinine, Ser: 0.5 mg/dL (ref 0.44–1.00)
GFR, Estimated: >60 mL/min (ref >60)
Glucose, Bld: 104 mg/dL — ABNORMAL HIGH (ref 70–99)
Potassium: 3.3 mmol/L — ABNORMAL LOW (ref 3.5–5.1)
Sodium: 136 mmol/L (ref 135–145)

## 2022-06-01 LAB — RESP PANEL BY RT-PCR (RSV, FLU A&B, COVID)  RVPGX2
Influenza A by PCR: NEGATIVE
Influenza B by PCR: NEGATIVE
Resp Syncytial Virus by PCR: POSITIVE — AB
SARS Coronavirus 2 by RT PCR: NEGATIVE

## 2022-06-01 LAB — CBC
HCT: 29.4 % — ABNORMAL LOW (ref 36.0–46.0)
Hemoglobin: 9.7 g/dL — ABNORMAL LOW (ref 12.0–15.0)
MCH: 29.3 pg (ref 26.0–34.0)
MCHC: 33 g/dL (ref 30.0–36.0)
MCV: 88.8 fL (ref 80.0–100.0)
Platelets: 204 10*3/uL (ref 150–400)
RBC: 3.31 MIL/uL — ABNORMAL LOW (ref 3.87–5.11)
RDW: 13.3 % (ref 11.5–15.5)
WBC: 13.7 10*3/uL — ABNORMAL HIGH (ref 4.0–10.5)
nRBC: 0 % (ref 0.0–0.2)

## 2022-06-01 LAB — PROTIME-INR
INR: 1.2 (ref 0.8–1.2)
INR: 1.2 (ref 0.8–1.2)
Prothrombin Time: 14.6 seconds (ref 11.4–15.2)
Prothrombin Time: 14.7 seconds (ref 11.4–15.2)

## 2022-06-01 LAB — LACTIC ACID, PLASMA
Lactic Acid, Venous: 1.3 mmol/L (ref 0.5–1.9)
Lactic Acid, Venous: 1.4 mmol/L (ref 0.5–1.9)

## 2022-06-01 LAB — CORTISOL-AM, BLOOD: Cortisol - AM: 18.7 ug/dL (ref 6.7–22.6)

## 2022-06-01 LAB — PROCALCITONIN
Procalcitonin: 0.5 ng/mL
Procalcitonin: 0.43 ng/mL

## 2022-06-01 LAB — APTT: aPTT: 32 seconds (ref 24–36)

## 2022-06-01 LAB — TROPONIN I (HIGH SENSITIVITY): Troponin I (High Sensitivity): 27 ng/L — ABNORMAL HIGH (ref <18)

## 2022-06-01 MED ORDER — IPRATROPIUM-ALBUTEROL 0.5-2.5 (3) MG/3ML IN SOLN
3.0000 mL | Freq: Every day | RESPIRATORY_TRACT | Status: DC
  Filled 2022-06-01: qty 3

## 2022-06-01 MED ORDER — SODIUM CHLORIDE 0.9 % IV SOLN
2.0000 g | INTRAVENOUS | Status: AC
  Administered 2022-06-01 – 2022-06-04 (×5): 2 g via INTRAVENOUS
  Filled 2022-06-01: qty 20
  Filled 2022-06-01 (×2): qty 2
  Filled 2022-06-01 (×2): qty 20

## 2022-06-01 MED ORDER — IPRATROPIUM-ALBUTEROL 0.5-2.5 (3) MG/3ML IN SOLN
3.0000 mL | Freq: Four times a day (QID) | RESPIRATORY_TRACT | Status: DC
  Administered 2022-06-01 (×2): 3 mL via RESPIRATORY_TRACT
  Filled 2022-06-01 (×3): qty 3

## 2022-06-01 MED ORDER — SODIUM CHLORIDE 0.9 % IV SOLN
500.0000 mg | INTRAVENOUS | Status: DC
  Administered 2022-06-01 – 2022-06-02 (×2): 500 mg via INTRAVENOUS
  Filled 2022-06-01: qty 5
  Filled 2022-06-01: qty 500

## 2022-06-01 MED ORDER — METHYLPREDNISOLONE SODIUM SUCC 40 MG IJ SOLR
40.0000 mg | Freq: Every day | INTRAMUSCULAR | Status: DC
  Administered 2022-06-02 – 2022-06-06 (×5): 40 mg via INTRAVENOUS
  Filled 2022-06-01 (×5): qty 1

## 2022-06-01 MED ORDER — POTASSIUM CHLORIDE 20 MEQ PO PACK
60.0000 meq | PACK | Freq: Once | ORAL | Status: AC
  Administered 2022-06-01: 60 meq via ORAL
  Filled 2022-06-01: qty 3

## 2022-06-01 MED ORDER — PREDNISONE 20 MG PO TABS
40.0000 mg | ORAL_TABLET | Freq: Every day | ORAL | Status: DC
  Administered 2022-06-01: 40 mg via ORAL
  Filled 2022-06-01: qty 2

## 2022-06-01 MED ORDER — ALBUTEROL SULFATE (2.5 MG/3ML) 0.083% IN NEBU: 2.5000 mg | INHALATION_SOLUTION | RESPIRATORY_TRACT | Status: DC | PRN

## 2022-06-01 NOTE — Assessment & Plan Note (Deleted)
-   We will continue Aricept and Namenda.Marland Kitchen

## 2022-06-01 NOTE — Evaluation (Signed)
Clinical/Bedside Swallow Evaluation Patient Details  Name: Beth Randall MRN: [id number] Date of Birth: 1923/09/08  Today's Date: 06/01/2022 Time: SLP Start Time (ACUTE ONLY): 1400 SLP Stop Time (ACUTE ONLY): 3875 SLP Time Calculation (min) (ACUTE ONLY): 32 min  Past Medical History:  Past Medical History:  Diagnosis Date   Breast cancer ([place])    Dementia ([place])    Past Surgical History:  Past Surgical History:  Procedure Laterality Date   BACK SURGERY     MASTECTOMY Left    HPI:  Beth Randall is a 88 y.o. female with past medical history significant for dementia, depression, breast cancer s/p mastectomy who presented to [facility] ED on 12/28 with progressive shortness of breath, cough over the last week.  Recently diagnosed with RSV on 12/23.  Family reports decreased oral intake.  She is bed bound/wheelchair dependent at baseline over the last 3 years. Pt admitted for management of acute hypoxic respiratory failure secondary to multifocal pneumonia in the setting of RSV viral infection.    Assessment / Plan / Recommendation  Clinical Impression  Pt was sitting upright but not alert/awake when pt's family was observed putting spoon to pt's mouth containing broth from soup. Pt with no response to spoon at mouth, Pt's family report she was consuming purees with thin liquids at baseline prior to admission but was experiencing a decline in amount of POs consumed. They report that pt consumed an applesauce and several bites of grits this morning without any coughing. SLP removed pt's covers, provide sternal rub and actively moved pt's extremities without any response or increase in arousal. Extensive education provided to patient's family on arousal requirements prior to providing any food or liquid to pt. They voiced understanding. Secure chat sent to pt's attending and nurse with results of this evaluation. In the setting of pt's chronic co morbilities and current illness, recommend Palliative  Care consult.  SLP Visit Diagnosis: Dysphagia, unspecified (R13.10)    Aspiration Risk  Risk for inadequate nutrition/hydration    Diet Recommendation  (currently recommended diet)   Liquid Administration via: Cup;Straw Medication Administration: Crushed with puree Supervision: Full supervision/cueing for compensatory strategies Compensations: Minimize environmental distractions;Slow rate;Small sips/bites Postural Changes: Seated upright at 90 degrees    Other  Recommendations Oral Care Recommendations: Oral care QID    Recommendations for follow up therapy are one component of a multi-disciplinary discharge planning process, led by the attending physician.  Recommendations may be updated based on patient status, additional functional criteria and insurance authorization.  Follow up Recommendations No SLP follow up                   Prognosis Prognosis for Safe Diet Advancement:  (Poor) Barriers to Reach Goals: Cognitive deficits;Time post onset;Severity of deficits (baseline diet)      Swallow Study   General Date of Onset: 05/31/22 HPI: Beth Randall is a 88 y.o. female with past medical history significant for dementia, depression, breast cancer s/p mastectomy who presented to [facility] ED on 12/28 with progressive shortness of breath, cough over the last week.  Recently diagnosed with RSV on 12/23.  Family reports decreased oral intake.  She is bed bound/wheelchair dependent at baseline over the last 3 years. Pt admitted for management of acute hypoxic respiratory failure secondary to multifocal pneumonia in the setting of RSV viral infection. Type of Study: Bedside Swallow Evaluation Previous Swallow Assessment: none in chart Diet Prior to this Study:  (thin liquid diet) Temperature Spikes Noted: No  Respiratory Status: Nasal cannula History of Recent Intubation: No Behavior/Cognition:  (unarousable) Oral Cavity Assessment:  (unable to observe) Oral Care Completed by SLP:   (pt unable to open her mouth) Baseline Vocal Quality: Not observed Volitional Cough: Cognitively unable to elicit Volitional Swallow: Unable to elicit    Oral/Motor/Sensory Function Overall Oral Motor/Sensory Function:  (not observed)   Ice Chips Ice chips: Not tested   Thin Liquid Thin Liquid: Not tested    Nectar Thick Nectar Thick Liquid: Not tested   Honey Thick Honey Thick Liquid: Not tested   Puree Puree: Not tested   Solid     Solid: Not tested     Beth Randall, M.S., CCC-SLP, Mining engineer Certified Brain Injury [place]  [place] Office [phone] Ascom [phone] Fax [phone]

## 2022-06-01 NOTE — Assessment & Plan Note (Signed)
-   The patient will be admitted to a medical telemetry bed. - We will continue antibiotic therapy with IV Levaquin. - Mucolytic therapy will be provided as well as bronchodilator therapy. - We will follow blood cultures.

## 2022-06-01 NOTE — Assessment & Plan Note (Signed)
-   We will continue Aricept and Namenda.

## 2022-06-01 NOTE — Progress Notes (Addendum)
PROGRESS NOTE    [name]  IOE:[id number] DOB: 1923-08-02 DOA: 05/31/2022 PCP: Baxter Hire, MD    Brief Narrative:   Beth Randall is a 88 y.o. female with past medical history significant for dementia, depression, breast cancer s/p mastectomy who presented to [facility] ED on 12/28 with progressive shortness of breath, cough over the last week.  Recently diagnosed with RSV on 12/23.  Family reports decreased oral intake.  She is bed bound/wheelchair dependent at baseline over the last 3 years.  In the ED, patient was noted to be hypoxic with SpO2 in the 80s, not oxygen dependent at baseline.  Temperature 97.9 F, HR 73, RR 18, BP 104/60.  Sodium 131, potassium 3.6, chloride 99, CO2 20, glucose 103, BUN 26, creatinine 0.54.  High sensitive troponin 18> 27.  WBC 16.1, hemoglobin 11.6, platelets 225.  INR 1.2.  COVID-19 PCR negative.  Procalcitonin 0.43, lactic acid 1.4.  Influenza A/B PCR negative.  RSV positive.  Chest x-ray with interval development of multifocal bilateral airspace disease, greatest at the lung bases consistent with bilateral pneumonia, small left pleural effusion.  Patient was given DuoNebs, IV Levaquin.  EDP consulted [facility] for admission for further evaluation and management of acute hypoxic respite failure secondary to multifocal pneumonia in the setting of RSV viral infection.  Assessment & Plan:   Acute hypoxic respiratory failure, POA Multifocal pneumonia RSV viral infection Patient presenting with progressive shortness of breath, cough.  Recently diagnosed with RSV roughly 1 week prior.  Patient now with elevated WBC count of 16.1, hypoxic requiring 4 L nasal cannula on admission and procalcitonin 0.43, lactic acid 1.4.  Chest x-ray with findings of multifocal pneumonia. --WBC 16.1>13.7 -- Azithromycin 500 mg IV every 24 hours x 5 days -- Ceftriaxone 2 g IV every 24 hours -- Prednisone 40 mg p.o. daily -- DuoNeb every 6 hours -- Albuterol neb every 4 hours as  needed wheezing/shortness of breath --Continue supplemental oxygen, maintain SpO2 greater than 92%, currently on 2 L nasal cannula with SpO2 97% at rest.  Not oxygen dependent at baseline. -- Supportive care, antitussives  Hyponatremia: Resolved Sodium 131 admission, likely hypovolemic hyponatremia in the setting of poor oral intake.  Supported with IV fluid hydration with improvement of sodium to 136, now resolved. -- Continue encourage increased oral intake -- NS at 75 mL/h -- Repeat BMP in a.m.  Dysphagia Son reports coughing while eating. -- SLP evaluation -- Full liquid diet for now  Hypokalemia Potassium 3.3, will replete. -- Repeat electrolytes in a.m.  Dementia without behavioral disturbance --Delirium precautions --Get up during the day --Encourage a familiar face to remain present throughout the day --Keep blinds open and lights on during daylight hours --Minimize the use of opioids/benzodiazepines --Aricept 10 mg p.o. nightly --Namenda 20 mg p.o. nightly --Melatonin 3 mg p.o. nightly  Depression: -- Zoloft 75 mg p.o. daily  Chronic weakness/debility/deconditioning: Currently resides with son and daughter-in-law.  Has been bedbound/wheelchair dependent over the last 3 years. --Supportive care, fall precautions.   DVT prophylaxis: enoxaparin (LOVENOX) injection 30 mg Start: 06/01/22 1000    Code Status: DNR Family Communication: Updated patient's son and daughter-in-law present at bedside this morning  Disposition Plan:  Level of care: Telemetry Medical Status is: Inpatient Remains inpatient appropriate because: IV antibiotics, needs titration off of Supple oxygen as she is not dependent at baseline.  Anticipate discharge home in 2-3 days    Consultants:  None  Procedures:  None  Antimicrobials:  Levaquin  12/28 - 12/28 Azithromycin 12/28>> Ceftriaxone 12/28>>   Subjective: Patient seen examined bedside, resting calmly.  Sleeping but arousable.   Pleasantly confused.  Son and daughter-in-law present.  Family appreciative all the care that patient is receiving in the hospital.  O2 requirements improve, now down to 2 L nasal cannula.  Son reports patient has been bedbound/wheelchair dependent over the past 3 years.  No other questions or concerns at this time.  No acute issues overnight per nursing staff.  Objective: Vitals:   06/01/22 0730 06/01/22 0800 06/01/22 0830 06/01/22 0900  BP: (!) 93/49 (!) 92/51 (!) 99/51 97/66  Pulse: 77 74 74 88  Resp: '19 18 15 '$ (!) 23  Temp:      TempSrc:      SpO2: 97% 96% 96% 95%  Weight:      Height:        Intake/Output Summary (Last 24 hours) at 06/01/2022 1050 Last data filed at 06/01/2022 0404 Gross per 24 hour  Intake 850 ml  Output --  Net 850 ml   Filed Weights   05/31/22 1637  Weight: 45.4 kg    Examination:  Physical Exam: GEN: NAD, alert, pleasantly confused, elderly/chronically ill in appearance HEENT: NCAT, PERRL, EOMI, sclera clear, MMM PULM: Breath sound diminished bilateral bases, mild crackles, no wheezing, normal respiratory effort without accessory muscle use, on 2 L nasal cannula with SpO2 96-97% at rest CV: RRR w/o M/G/R GI: abd soft, NTND, NABS, no R/G/M MSK: no peripheral edema, moves all EXTR independently Integumentary: No concerning wounds/rashes/lesions noted on exposed skin surfaces    Data Reviewed: I have personally reviewed following labs and imaging studies  CBC: Recent Labs  Lab 05/26/22 1458 05/31/22 1828 06/01/22 0542  WBC 9.6 16.1* 13.7*  HGB 12.8 11.6* 9.7*  HCT 40.7 35.8* 29.4*  MCV 94.2 90.6 88.8  PLT 194 225 326   Basic Metabolic Panel: Recent Labs  Lab 05/26/22 1458 05/31/22 1828 06/01/22 0542  NA 136 131* 136  K 4.1 3.6 3.3*  CL 103 99 104  CO2 26 20* 22  GLUCOSE 102* 103* 104*  BUN 30* 26* 21  CREATININE 0.64 0.54 0.50  CALCIUM 9.0 8.6* 7.9*   GFR: Estimated Creatinine Clearance: 29.5 mL/min (by C-G formula based on  SCr of 0.5 mg/dL). Liver Function Tests: No results for input(s): "AST", "ALT", "ALKPHOS", "BILITOT", "PROT", "ALBUMIN" in the last 168 hours. No results for input(s): "LIPASE", "AMYLASE" in the last 168 hours. No results for input(s): "AMMONIA" in the last 168 hours. Coagulation Profile: Recent Labs  Lab 05/31/22 2350 06/01/22 0542  INR 1.2 1.2   Cardiac Enzymes: No results for input(s): "CKTOTAL", "CKMB", "CKMBINDEX", "TROPONINI" in the last 168 hours. BNP (last 3 results) No results for input(s): "PROBNP" in the last 8760 hours. HbA1C: No results for input(s): "HGBA1C" in the last 72 hours. CBG: No results for input(s): "GLUCAP" in the last 168 hours. Lipid Profile: No results for input(s): "CHOL", "HDL", "LDLCALC", "TRIG", "CHOLHDL", "LDLDIRECT" in the last 72 hours. Thyroid Function Tests: No results for input(s): "TSH", "T4TOTAL", "FREET4", "T3FREE", "THYROIDAB" in the last 72 hours. Anemia Panel: No results for input(s): "VITAMINB12", "FOLATE", "FERRITIN", "TIBC", "IRON", "RETICCTPCT" in the last 72 hours. Sepsis Labs: Recent Labs  Lab 05/31/22 2350 05/31/22 2352 06/01/22 0246 06/01/22 0542  PROCALCITON 0.50  --   --  0.43  LATICACIDVEN  --  1.3 1.4  --     Recent Results (from the past 240 hour(s))  Resp panel by RT-PCR (  RSV, Flu A&B, Covid) Anterior Nasal Swab     Status: Abnormal   Collection Time: 05/26/22  2:56 PM   Specimen: Anterior Nasal Swab  Result Value Ref Range Status   SARS Coronavirus 2 by RT PCR NEGATIVE NEGATIVE Final    Comment: (NOTE) SARS-CoV-2 target nucleic acids are NOT DETECTED.  The SARS-CoV-2 RNA is generally detectable in upper respiratory specimens during the acute phase of infection. The lowest concentration of SARS-CoV-2 viral copies this assay can detect is 138 copies/mL. A negative result does not preclude SARS-Cov-2 infection and should not be used as the sole basis for treatment or other patient management decisions. A  negative result may occur with  improper specimen collection/handling, submission of specimen other than nasopharyngeal swab, presence of viral mutation(s) within the areas targeted by this assay, and inadequate number of viral copies(<138 copies/mL). A negative result must be combined with clinical observations, patient history, and epidemiological information. The expected result is Negative.  Fact Sheet for Patients:  EntrepreneurPulse.com.au  Fact Sheet for Healthcare Providers:  IncredibleEmployment.be  This test is no t yet approved or cleared by the [place] FDA and  has been authorized for detection and/or diagnosis of SARS-CoV-2 by FDA under an Emergency Use Authorization (EUA). This EUA will remain  in effect (meaning this test can be used) for the duration of the COVID-19 declaration under Section 564(b)(1) of the Act, 21 U.S.C.section 360bbb-3(b)(1), unless the authorization is terminated  or revoked sooner.       Influenza A by PCR NEGATIVE NEGATIVE Final   Influenza B by PCR NEGATIVE NEGATIVE Final    Comment: (NOTE) The Xpert Xpress SARS-CoV-2/FLU/RSV plus assay is intended as an aid in the diagnosis of influenza from Nasopharyngeal swab specimens and should not be used as a sole basis for treatment. Nasal washings and aspirates are unacceptable for Xpert Xpress SARS-CoV-2/FLU/RSV testing.  Fact Sheet for Patients: EntrepreneurPulse.com.au  Fact Sheet for Healthcare Providers: IncredibleEmployment.be  This test is not yet approved or cleared by the [place] FDA and has been authorized for detection and/or diagnosis of SARS-CoV-2 by FDA under an Emergency Use Authorization (EUA). This EUA will remain in effect (meaning this test can be used) for the duration of the COVID-19 declaration under Section 564(b)(1) of the Act, 21 U.S.C. section 360bbb-3(b)(1), unless the authorization  is terminated or revoked.     Resp Syncytial Virus by PCR POSITIVE (A) NEGATIVE Final    Comment: (NOTE) Fact Sheet for Patients: EntrepreneurPulse.com.au  Fact Sheet for Healthcare Providers: IncredibleEmployment.be  This test is not yet approved or cleared by the [place] FDA and has been authorized for detection and/or diagnosis of SARS-CoV-2 by FDA under an Emergency Use Authorization (EUA). This EUA will remain in effect (meaning this test can be used) for the duration of the COVID-19 declaration under Section 564(b)(1) of the Act, 21 U.S.C. section 360bbb-3(b)(1), unless the authorization is terminated or revoked.  Performed at [facility], [address] [place], [place] [address]   Blood Culture (routine x 2)     Status: None (Preliminary result)   Collection Time: 05/31/22 11:50 PM   Specimen: BLOOD  Result Value Ref Range Status   Specimen Description BLOOD BLOOD RIGHT WRIST  Final   Special Requests   Final    BOTTLES DRAWN AEROBIC AND ANAEROBIC Blood Culture adequate volume   Culture   Final    NO GROWTH < 12 HOURS Performed at [facility], [place],  [place] [address]    Report Status PENDING  Incomplete  Blood Culture (routine x 2)     Status: None (Preliminary result)   Collection Time: 05/31/22 11:50 PM   Specimen: BLOOD  Result Value Ref Range Status   Specimen Description BLOOD BLOOD RIGHT FOREARM  Final   Special Requests   Final    BOTTLES DRAWN AEROBIC AND ANAEROBIC Blood Culture adequate volume   Culture   Final    NO GROWTH < 12 HOURS Performed at [facility], [address] [place], [place] [address]    Report Status PENDING  Incomplete  Resp panel by RT-PCR (RSV, Flu A&B, Covid) Anterior Nasal Swab     Status: Abnormal   Collection Time: 05/31/22 11:52 PM   Specimen: Anterior Nasal Swab  Result Value Ref Range Status   SARS Coronavirus 2 by RT PCR  NEGATIVE NEGATIVE Final    Comment: (NOTE) SARS-CoV-2 target nucleic acids are NOT DETECTED.  The SARS-CoV-2 RNA is generally detectable in upper respiratory specimens during the acute phase of infection. The lowest concentration of SARS-CoV-2 viral copies this assay can detect is 138 copies/mL. A negative result does not preclude SARS-Cov-2 infection and should not be used as the sole basis for treatment or other patient management decisions. A negative result may occur with  improper specimen collection/handling, submission of specimen other than nasopharyngeal swab, presence of viral mutation(s) within the areas targeted by this assay, and inadequate number of viral copies(<138 copies/mL). A negative result must be combined with clinical observations, patient history, and epidemiological information. The expected result is Negative.  Fact Sheet for Patients:  EntrepreneurPulse.com.au  Fact Sheet for Healthcare Providers:  IncredibleEmployment.be  This test is no t yet approved or cleared by the [place] FDA and  has been authorized for detection and/or diagnosis of SARS-CoV-2 by FDA under an Emergency Use Authorization (EUA). This EUA will remain  in effect (meaning this test can be used) for the duration of the COVID-19 declaration under Section 564(b)(1) of the Act, 21 U.S.C.section 360bbb-3(b)(1), unless the authorization is terminated  or revoked sooner.       Influenza A by PCR NEGATIVE NEGATIVE Final   Influenza B by PCR NEGATIVE NEGATIVE Final    Comment: (NOTE) The Xpert Xpress SARS-CoV-2/FLU/RSV plus assay is intended as an aid in the diagnosis of influenza from Nasopharyngeal swab specimens and should not be used as a sole basis for treatment. Nasal washings and aspirates are unacceptable for Xpert Xpress SARS-CoV-2/FLU/RSV testing.  Fact Sheet for Patients: EntrepreneurPulse.com.au  Fact Sheet for  Healthcare Providers: IncredibleEmployment.be  This test is not yet approved or cleared by the [place] FDA and has been authorized for detection and/or diagnosis of SARS-CoV-2 by FDA under an Emergency Use Authorization (EUA). This EUA will remain in effect (meaning this test can be used) for the duration of the COVID-19 declaration under Section 564(b)(1) of the Act, 21 U.S.C. section 360bbb-3(b)(1), unless the authorization is terminated or revoked.     Resp Syncytial Virus by PCR POSITIVE (A) NEGATIVE Final    Comment: (NOTE) Fact Sheet for Patients: EntrepreneurPulse.com.au  Fact Sheet for Healthcare Providers: IncredibleEmployment.be  This test is not yet approved or cleared by the [place] FDA and has been authorized for detection and/or diagnosis of SARS-CoV-2 by FDA under an Emergency Use Authorization (EUA). This EUA will remain in effect (meaning this test can be used) for the duration of the COVID-19 declaration under Section 564(b)(1) of the Act, 21  U.S.C. section 360bbb-3(b)(1), unless the authorization is terminated or revoked.  Performed at [facility], [address], [place], [place] [address]          Radiology Studies: DG Chest Marshall 1 View  Result Date: 05/31/2022 CLINICAL DATA:  Hypoxia, RSV, cough EXAM: PORTABLE CHEST 1 VIEW COMPARISON:  05/26/2022 FINDINGS: Single frontal view of the chest demonstrates interval development of bilateral interstitial and ground-glass opacities. There are areas of denser consolidation seen in the retrocardiac region and at the right lung base. Small left pleural effusion is suspected. No pneumothorax. Cardiac silhouette is unremarkable. IMPRESSION: 1. Interval development of multifocal bilateral airspace disease, greatest at the lung bases, consistent with bilateral pneumonia. 2. Small left pleural effusion, increased since prior study.  Electronically Signed   By: Randa Ngo M.D.   On: 05/31/2022 17:34        Scheduled Meds:  memantine  28 mg Oral QHS   And   donepezil  10 mg Oral QHS   enoxaparin (LOVENOX) injection  30 mg Subcutaneous Q24H   ipratropium-albuterol  3 mL Nebulization Q6H   loratadine  10 mg Oral Daily   predniSONE  40 mg Oral Q breakfast   sertraline  75 mg Oral Daily   Continuous Infusions:  sodium chloride 100 mL/hr at 06/01/22 1037   azithromycin Stopped (06/01/22 0404)   cefTRIAXone (ROCEPHIN)  IV Stopped (06/01/22 0051)     LOS: 1 day    Time spent: 51 minutes spent on chart review, discussion with nursing staff, consultants, updating family and interview/physical exam; more than 50% of that time was spent in counseling and/or coordination of care.    Reyaan Thoma J [name] DO [facility] Available via Epic secure chat 7am-7pm After these hours, please refer to coverage provider listed on [email or web address] 06/01/2022, 10:50 AM

## 2022-06-01 NOTE — Progress Notes (Signed)
Eeg done 

## 2022-06-01 NOTE — Assessment & Plan Note (Addendum)
-   This is manifested by tachypnea and leukocytosis. - She meets severe sepsis criteria given her associated acute respiratory failure with hypoxia due to her pneumonia - Management as above. - She will be hydrated with IV normal saline. - We will follow blood cultures.

## 2022-06-01 NOTE — Assessment & Plan Note (Signed)
-   We will continue Zoloft 

## 2022-06-01 NOTE — Assessment & Plan Note (Signed)
-   She was positive for RSV on 12/23 and during this visit. - She likely has secondary bacterial infection.

## 2022-06-02 ENCOUNTER — Encounter: Payer: Self-pay | Admitting: Family Medicine

## 2022-06-02 DIAGNOSIS — R4182 Altered mental status, unspecified: Secondary | ICD-10-CM

## 2022-06-02 DIAGNOSIS — J189 Pneumonia, unspecified organism: Secondary | ICD-10-CM | POA: Diagnosis not present

## 2022-06-02 LAB — BASIC METABOLIC PANEL
Anion gap: 7 (ref 5–15)
BUN: 14 mg/dL (ref 8–23)
CO2: 23 mmol/L (ref 22–32)
Calcium: 8.1 mg/dL — ABNORMAL LOW (ref 8.9–10.3)
Chloride: 110 mmol/L (ref 98–111)
Creatinine, Ser: 0.56 mg/dL (ref 0.44–1.00)
GFR, Estimated: >60 mL/min (ref >60)
Glucose, Bld: 110 mg/dL — ABNORMAL HIGH (ref 70–99)
Potassium: 4.3 mmol/L (ref 3.5–5.1)
Sodium: 140 mmol/L (ref 135–145)

## 2022-06-02 LAB — CBC
HCT: 30 % — ABNORMAL LOW (ref 36.0–46.0)
Hemoglobin: 9.8 g/dL — ABNORMAL LOW (ref 12.0–15.0)
MCH: 29.2 pg (ref 26.0–34.0)
MCHC: 32.7 g/dL (ref 30.0–36.0)
MCV: 89.3 fL (ref 80.0–100.0)
Platelets: 215 10*3/uL (ref 150–400)
RBC: 3.36 MIL/uL — ABNORMAL LOW (ref 3.87–5.11)
RDW: 13.5 % (ref 11.5–15.5)
WBC: 9.4 10*3/uL (ref 4.0–10.5)
nRBC: 0 % (ref 0.0–0.2)

## 2022-06-02 LAB — MAGNESIUM: Magnesium: 2 mg/dL (ref 1.7–2.4)

## 2022-06-02 LAB — PROCALCITONIN: Procalcitonin: 0.28 ng/mL

## 2022-06-02 MED ORDER — LEVALBUTEROL HCL 0.63 MG/3ML IN NEBU
0.6300 mg | INHALATION_SOLUTION | Freq: Every day | RESPIRATORY_TRACT | Status: DC
  Administered 2022-06-02: 0.63 mg via RESPIRATORY_TRACT
  Filled 2022-06-02 (×3): qty 3

## 2022-06-02 MED ORDER — INFLUENZA VAC A&B SA ADJ QUAD 0.5 ML IM PRSY
0.5000 mL | PREFILLED_SYRINGE | INTRAMUSCULAR | Status: DC
  Filled 2022-06-02: qty 0.5

## 2022-06-02 MED ORDER — SODIUM CHLORIDE 0.9 % IV SOLN
500.0000 mg | Freq: Every day | INTRAVENOUS | Status: AC
  Administered 2022-06-02 – 2022-06-04 (×3): 500 mg via INTRAVENOUS
  Filled 2022-06-02 (×2): qty 5
  Filled 2022-06-02: qty 500

## 2022-06-02 MED ORDER — AZITHROMYCIN 250 MG PO TABS: 500.0000 mg | ORAL_TABLET | Freq: Every day | ORAL | Status: DC

## 2022-06-02 NOTE — Progress Notes (Signed)
PROGRESS NOTE    Beth Randall  KPT:[id number] DOB: [birth year] DOA: 05/31/2022 PCP: Baxter Hire, MD    Brief Narrative:   Beth Randall is a 88 y.o. female with past medical history significant for dementia, depression, breast cancer s/p mastectomy who presented to [facility] ED on 12/28 with progressive shortness of breath, cough over the last week.  Recently diagnosed with RSV on 12/23.  Family reports decreased oral intake.  She is bed bound/wheelchair dependent at baseline over the last 3 years.  In the ED, patient was noted to be hypoxic with SpO2 in the 80s, not oxygen dependent at baseline.  Temperature 97.9 F, HR 73, RR 18, BP 104/60.  Sodium 131, potassium 3.6, chloride 99, CO2 20, glucose 103, BUN 26, creatinine 0.54.  High sensitive troponin 18> 27.  WBC 16.1, hemoglobin 11.6, platelets 225.  INR 1.2.  COVID-19 PCR negative.  Procalcitonin 0.43, lactic acid 1.4.  Influenza A/B PCR negative.  RSV positive.  Chest x-ray with interval development of multifocal bilateral airspace disease, greatest at the lung bases consistent with bilateral pneumonia, small left pleural effusion.  Patient was given DuoNebs, IV Levaquin.  EDP consulted [facility] for admission for further evaluation and management of acute hypoxic respite failure secondary to multifocal pneumonia in the setting of RSV viral infection.  Assessment & Plan:   Acute hypoxic respiratory failure, POA Multifocal pneumonia RSV viral infection Acute metabolic encephalopathy Patient presenting with progressive shortness of breath, cough.  Recently diagnosed with RSV roughly 1 week prior.  Patient now with elevated WBC count of 16.1, hypoxic requiring 4 L nasal cannula on admission and procalcitonin 0.43, lactic acid 1.4.  Chest x-ray with findings of multifocal pneumonia.  MR brain without contrast with 2-3 mm thick subdural collections overlying both cerebral hemispheres likely reflective of chronic SDH without mass effect.  EEG with  no epileptiform discharges or seizure-like activity noted. --WBC 16.1>13.7 -- Azithromycin 500 mg IV every 24 hours x 5 days -- Ceftriaxone 2 g IV every 24 hours -- Solu-Medrol 40 mg IV every 24 hours -- DuoNeb every 6 hours -- Xopenex neb PRN shortness of breath/wheezing -- Continue supplemental oxygen, maintain SpO2 greater than 92%, currently on 2 L nasal cannula with SpO2 98% at rest.  Not oxygen dependent at baseline. -- Supportive care, antitussives  Hyponatremia: Resolved Sodium 131 admission, likely hypovolemic hyponatremia in the setting of poor oral intake.  Supported with IV fluid hydration with improvement of sodium to 138, now resolved. -- Continue encourage increased oral intake -- NS at 125 mL/h -- Repeat BMP in a.m.  Dysphagia Son reports coughing while eating. -- SLP evaluation -- Full liquid diet for now  Hypokalemia: Resolved Repleted, potassium 4.3 this morning. -- Repeat electrolytes in a.m.  Advanced dementia without behavioral disturbance --Delirium precautions --Get up during the day --Encourage a familiar face to remain present throughout the day --Keep blinds open and lights on during daylight hours --Minimize the use of opioids/benzodiazepines --Aricept 10 mg p.o. nightly --Namenda 20 mg p.o. nightly --Melatonin 3 mg p.o. nightly  Chronic SDH MR brain without contrast with 2-3 mm thick subdural collections overlying both cerebral hemispheres favored to reflect chronic subdural hematomas without mass effect or midline shift.  Patient's son reports previous head trauma likely correlating with findings.  Depression: -- Zoloft 75 mg p.o. daily  Chronic weakness/debility/deconditioning: Adult failure to thrive Currently resides with son and daughter-in-law.  Has been bedbound/wheelchair dependent over the last 3 years.  Patient remains with  poor oral intake.  Discussed with patient's son and daughter-in-law that dementia is terminal and she is likely  nearing end-of-life; likely need to consider transitioning to hospice -- Supportive care, fall precautions. -- Palliative care consulted for assistance with goals of care and medical decision making   DVT prophylaxis: Place and maintain sequential compression device Start: 06/02/22 0732    Code Status: DNR Family Communication: Updated patient's son and daughter-in-law present at bedside this morning  Disposition Plan:  Level of care: Telemetry Medical Status is: Inpatient Remains inpatient appropriate because: IV antibiotics, needs titration off of supplemental oxygen as she is not dependent at baseline.  Anticipate discharge home in 2-3 days.  Palliative care consult pending, overall prognosis is extremely poor given terminal illness of advanced dementia and adult failure to thrive    Consultants:  Palliative care  Procedures:  None  Antimicrobials:  Levaquin 12/28 - 12/28 Azithromycin 12/28>> Ceftriaxone 12/28>>   Subjective: Patient seen examined bedside, resting calmly.  Son and daughter-in-law present.  RN present in room.  Patient slightly more alert but continues with very poor oral intake.  Discussed with family regarding terminal illness of dementia and patient likely nearing end-of-life given her failure to thrive and poor oral intake.  Discussed and introduced consideration of hospice care after treatment of her pneumonia. No other questions or concerns at this time.  Unable to pain any further ROS from patient given her mental status/dementia.  No acute issues overnight per nursing staff.  Objective: Vitals:   06/01/22 1800 06/01/22 1840 06/02/22 0500 06/02/22 0844  BP: (!) 83/48 (!) 102/53 (!) 116/59 109/67  Pulse: 72 74 75 70  Resp: '19 18 16 17  '$ Temp:  98.2 F (36.8 C) 97.8 F (36.6 C) 97.9 F (36.6 C)  TempSrc:   Oral   SpO2: 97% 98% 98% 98%  Weight:      Height:        Intake/Output Summary (Last 24 hours) at 06/02/2022 1233 Last data filed at  06/02/2022 0547 Gross per 24 hour  Intake 2350 ml  Output 200 ml  Net 2150 ml   Filed Weights   05/31/22 1637  Weight: 45.4 kg    Examination:  Physical Exam: GEN: NAD, alert, pleasantly confused/poorly responsive, elderly/chronically ill in appearance HEENT: NCAT, PERRL, EOMI, sclera clear, MMM PULM: Breath sound diminished bilateral bases, mild crackles, no wheezing, normal respiratory effort without accessory muscle use, on 2 L nasal cannula with SpO2 96-97% at rest CV: RRR w/o M/G/R GI: abd soft, NTND, NABS, no R/G/M MSK: no peripheral edema, moves all EXTR independently Integumentary: No concerning wounds/rashes/lesions noted on exposed skin surfaces    Data Reviewed: I have personally reviewed following labs and imaging studies  CBC: Recent Labs  Lab 05/26/22 1458 05/31/22 1828 06/01/22 0542 06/02/22 0454  WBC 9.6 16.1* 13.7* 9.4  HGB 12.8 11.6* 9.7* 9.8*  HCT 40.7 35.8* 29.4* 30.0*  MCV 94.2 90.6 88.8 89.3  PLT 194 225 204 387   Basic Metabolic Panel: Recent Labs  Lab 05/26/22 1458 05/31/22 1828 06/01/22 0542 06/02/22 0454  NA 136 131* 136 140  K 4.1 3.6 3.3* 4.3  CL 103 99 104 110  CO2 26 20* 22 23  GLUCOSE 102* 103* 104* 110*  BUN 30* 26* 21 14  CREATININE 0.64 0.54 0.50 0.56  CALCIUM 9.0 8.6* 7.9* 8.1*  MG  --   --   --  2.0   GFR: Estimated Creatinine Clearance: 29.5 mL/min (by C-G formula based  on SCr of 0.56 mg/dL). Liver Function Tests: No results for input(s): "AST", "ALT", "ALKPHOS", "BILITOT", "PROT", "ALBUMIN" in the last 168 hours. No results for input(s): "LIPASE", "AMYLASE" in the last 168 hours. No results for input(s): "AMMONIA" in the last 168 hours. Coagulation Profile: Recent Labs  Lab 05/31/22 2350 06/01/22 0542  INR 1.2 1.2   Cardiac Enzymes: No results for input(s): "CKTOTAL", "CKMB", "CKMBINDEX", "TROPONINI" in the last 168 hours. BNP (last 3 results) No results for input(s): "PROBNP" in the last 8760  hours. HbA1C: No results for input(s): "HGBA1C" in the last 72 hours. CBG: No results for input(s): "GLUCAP" in the last 168 hours. Lipid Profile: No results for input(s): "CHOL", "HDL", "LDLCALC", "TRIG", "CHOLHDL", "LDLDIRECT" in the last 72 hours. Thyroid Function Tests: No results for input(s): "TSH", "T4TOTAL", "FREET4", "T3FREE", "THYROIDAB" in the last 72 hours. Anemia Panel: No results for input(s): "VITAMINB12", "FOLATE", "FERRITIN", "TIBC", "IRON", "RETICCTPCT" in the last 72 hours. Sepsis Labs: Recent Labs  Lab 05/31/22 2350 05/31/22 2352 06/01/22 0246 06/01/22 0542 06/02/22 0454  PROCALCITON 0.50  --   --  0.43 0.28  LATICACIDVEN  --  1.3 1.4  --   --     Recent Results (from the past 240 hour(s))  Resp panel by RT-PCR (RSV, Flu A&B, Covid) Anterior Nasal Swab     Status: Abnormal   Collection Time: 05/26/22  2:56 PM   Specimen: Anterior Nasal Swab  Result Value Ref Range Status   SARS Coronavirus 2 by RT PCR NEGATIVE NEGATIVE Final    Comment: (NOTE) SARS-CoV-2 target nucleic acids are NOT DETECTED.  The SARS-CoV-2 RNA is generally detectable in upper respiratory specimens during the acute phase of infection. The lowest concentration of SARS-CoV-2 viral copies this assay can detect is 138 copies/mL. A negative result does not preclude SARS-Cov-2 infection and should not be used as the sole basis for treatment or other patient management decisions. A negative result may occur with  improper specimen collection/handling, submission of specimen other than nasopharyngeal swab, presence of viral mutation(s) within the areas targeted by this assay, and inadequate number of viral copies(<138 copies/mL). A negative result must be combined with clinical observations, patient history, and epidemiological information. The expected result is Negative.  Fact Sheet for Patients:  EntrepreneurPulse.com.au  Fact Sheet for Healthcare Providers:   IncredibleEmployment.be  This test is no t yet approved or cleared by the [place] FDA and  has been authorized for detection and/or diagnosis of SARS-CoV-2 by FDA under an Emergency Use Authorization (EUA). This EUA will remain  in effect (meaning this test can be used) for the duration of the COVID-19 declaration under Section 564(b)(1) of the Act, 21 U.S.C.section 360bbb-3(b)(1), unless the authorization is terminated  or revoked sooner.       Influenza A by PCR NEGATIVE NEGATIVE Final   Influenza B by PCR NEGATIVE NEGATIVE Final    Comment: (NOTE) The Xpert Xpress SARS-CoV-2/FLU/RSV plus assay is intended as an aid in the diagnosis of influenza from Nasopharyngeal swab specimens and should not be used as a sole basis for treatment. Nasal washings and aspirates are unacceptable for Xpert Xpress SARS-CoV-2/FLU/RSV testing.  Fact Sheet for Patients: EntrepreneurPulse.com.au  Fact Sheet for Healthcare Providers: IncredibleEmployment.be  This test is not yet approved or cleared by the [place] FDA and has been authorized for detection and/or diagnosis of SARS-CoV-2 by FDA under an Emergency Use Authorization (EUA). This EUA will remain in effect (meaning this test can be used) for the duration  of the COVID-19 declaration under Section 564(b)(1) of the Act, 21 U.S.C. section 360bbb-3(b)(1), unless the authorization is terminated or revoked.     Resp Syncytial Virus by PCR POSITIVE (A) NEGATIVE Final    Comment: (NOTE) Fact Sheet for Patients: EntrepreneurPulse.com.au  Fact Sheet for Healthcare Providers: IncredibleEmployment.be  This test is not yet approved or cleared by the [place] FDA and has been authorized for detection and/or diagnosis of SARS-CoV-2 by FDA under an Emergency Use Authorization (EUA). This EUA will remain in effect (meaning this test can be used)  for the duration of the COVID-19 declaration under Section 564(b)(1) of the Act, 21 U.S.C. section 360bbb-3(b)(1), unless the authorization is terminated or revoked.  Performed at [facility], [address] [place], [place] [address]   Blood Culture (routine x 2)     Status: None (Preliminary result)   Collection Time: 05/31/22 11:50 PM   Specimen: BLOOD  Result Value Ref Range Status   Specimen Description BLOOD BLOOD RIGHT WRIST  Final   Special Requests   Final    BOTTLES DRAWN AEROBIC AND ANAEROBIC Blood Culture adequate volume   Culture   Final    NO GROWTH 1 DAY Performed at [facility], [address] [place], [place] [address]    Report Status PENDING  Incomplete  Blood Culture (routine x 2)     Status: None (Preliminary result)   Collection Time: 05/31/22 11:50 PM   Specimen: BLOOD  Result Value Ref Range Status   Specimen Description BLOOD BLOOD RIGHT FOREARM  Final   Special Requests   Final    BOTTLES DRAWN AEROBIC AND ANAEROBIC Blood Culture adequate volume   Culture   Final    NO GROWTH 1 DAY Performed at [facility], [address] [place], [place] [address]    Report Status PENDING  Incomplete  Resp panel by RT-PCR (RSV, Flu A&B, Covid) Anterior Nasal Swab     Status: Abnormal   Collection Time: 05/31/22 11:52 PM   Specimen: Anterior Nasal Swab  Result Value Ref Range Status   SARS Coronavirus 2 by RT PCR NEGATIVE NEGATIVE Final    Comment: (NOTE) SARS-CoV-2 target nucleic acids are NOT DETECTED.  The SARS-CoV-2 RNA is generally detectable in upper respiratory specimens during the acute phase of infection. The lowest concentration of SARS-CoV-2 viral copies this assay can detect is 138 copies/mL. A negative result does not preclude SARS-Cov-2 infection and should not be used as the sole basis for treatment or other patient management decisions. A negative result may occur with  improper specimen collection/handling,  submission of specimen other than nasopharyngeal swab, presence of viral mutation(s) within the areas targeted by this assay, and inadequate number of viral copies(<138 copies/mL). A negative result must be combined with clinical observations, patient history, and epidemiological information. The expected result is Negative.  Fact Sheet for Patients:  EntrepreneurPulse.com.au  Fact Sheet for Healthcare Providers:  IncredibleEmployment.be  This test is no t yet approved or cleared by the [place] FDA and  has been authorized for detection and/or diagnosis of SARS-CoV-2 by FDA under an Emergency Use Authorization (EUA). This EUA will remain  in effect (meaning this test can be used) for the duration of the COVID-19 declaration under Section 564(b)(1) of the Act, 21 U.S.C.section 360bbb-3(b)(1), unless the authorization is terminated  or revoked sooner.       Influenza A by PCR NEGATIVE NEGATIVE Final   Influenza B by PCR NEGATIVE NEGATIVE Final  Comment: (NOTE) The Xpert Xpress SARS-CoV-2/FLU/RSV plus assay is intended as an aid in the diagnosis of influenza from Nasopharyngeal swab specimens and should not be used as a sole basis for treatment. Nasal washings and aspirates are unacceptable for Xpert Xpress SARS-CoV-2/FLU/RSV testing.  Fact Sheet for Patients: EntrepreneurPulse.com.au  Fact Sheet for Healthcare Providers: IncredibleEmployment.be  This test is not yet approved or cleared by the [place] FDA and has been authorized for detection and/or diagnosis of SARS-CoV-2 by FDA under an Emergency Use Authorization (EUA). This EUA will remain in effect (meaning this test can be used) for the duration of the COVID-19 declaration under Section 564(b)(1) of the Act, 21 U.S.C. section 360bbb-3(b)(1), unless the authorization is terminated or revoked.     Resp Syncytial Virus by PCR POSITIVE  (A) NEGATIVE Final    Comment: (NOTE) Fact Sheet for Patients: EntrepreneurPulse.com.au  Fact Sheet for Healthcare Providers: IncredibleEmployment.be  This test is not yet approved or cleared by the [place] FDA and has been authorized for detection and/or diagnosis of SARS-CoV-2 by FDA under an Emergency Use Authorization (EUA). This EUA will remain in effect (meaning this test can be used) for the duration of the COVID-19 declaration under Section 564(b)(1) of the Act, 21 U.S.C. section 360bbb-3(b)(1), unless the authorization is terminated or revoked.  Performed at [facility], [address], [place], [place] [address]          Radiology Studies: EEG adult  Result Date: 06-10-22 Lora Havens, MD     06/10/22  6:41 AM Patient Name: ANTONIA JICHA MRN: [id number] Epilepsy Attending: Lora Havens Referring Physician/Provider: [name] Hobson Lax J, DO Date: 06/10/22 Duration: 22.44 mins Patient history: 88yo F with ams. EEG to evaluate for seizure. Level of alertness: Awake, asleep AEDs during EEG study: None Technical aspects: This EEG study was done with scalp electrodes positioned according to the 10-20 International system of electrode placement. Electrical activity was reviewed with band pass filter of 1-'70Hz'$ , sensitivity of 7 uV/mm, display speed of 47m/sec with a '60Hz'$  notched filter applied as appropriate. EEG data were recorded continuously and digitally stored.  Video monitoring was available and reviewed as appropriate. Description: The posterior dominant rhythm consists of '8Hz'$  activity of moderate voltage (25-35 uV) seen predominantly in posterior head regions, symmetric and reactive to eye opening and eye closing. Sleep was characterized by vertex waves, sleep spindles (12 to 14 Hz), maximal frontocentral region. EEG showed intermittent generalized 3 to 6 Hz theta-delta slowing. Hyperventilation and photic  stimulation were not performed.   ABNORMALITY - Intermittent slow, generalized IMPRESSION: This study is suggestive of mild diffuse encephalopathy, nonspecific etiology. No seizures or epileptiform discharges were seen throughout the recording. PLora Havens  MR BRAIN WO CONTRAST  Result Date: 06/01/2022 CLINICAL DATA:  History of dementia and breast cancer, presenting with dyspnea and cough. EXAM: MRI HEAD WITHOUT CONTRAST TECHNIQUE: Multiplanar, multiecho pulse sequences of the brain and surrounding structures were obtained without intravenous contrast. COMPARISON:  CT head 07/02/2020 FINDINGS: Brain: There is no evidence of acute infarct. There is a thin right cerebral convexity subdural collection measuring 2-3 mm, and a thin collection overlying the left frontal lobe also measuring 2-3 mm. The right collection was present on the CT head from 07/02/2020; however, the left collection was not definitely seen on the study. There is no abnormal DWI signal abnormality or SWI signal dropout associated with the collections. There is no mass effect on the underlying brain parenchyma. There is marked  parenchymal volume loss with prominence of the ventricular system and extra-axial CSF spaces with marked bilateral hippocampal atrophy. There is extensive FLAIR signal abnormality throughout the supratentorial white matter and pons consistent with advanced chronic small-vessel ischemic change. The pituitary and suprasellar region are normal. There is no mass lesion. There is no midline shift. Vascular: Normal flow voids. Skull and upper cervical spine: Normal marrow signal. Sinuses/Orbits: There is layering fluid in the left maxillary sinus. A left lens implant is in place. The globes and orbits are otherwise unremarkable. Other: None. IMPRESSION: 1. 2-3 mm thick subdural collections overlying both cerebral hemispheres are favored to reflect chronic subdural hematomas, though the left collection is new since the  prior head CT from 2022. No mass effect on the underlying brain parenchyma or midline shift. Consider short interval follow-up noncontrast head CT to ensure that the collection is not enlarged. 2. Advanced parenchymal volume loss and chronic small-vessel ischemic change. Electronically Signed   By: Valetta Mole M.D.   On: 06/01/2022 16:18   DG Chest Port 1 View  Result Date: 05/31/2022 CLINICAL DATA:  Hypoxia, RSV, cough EXAM: PORTABLE CHEST 1 VIEW COMPARISON:  05/26/2022 FINDINGS: Single frontal view of the chest demonstrates interval development of bilateral interstitial and ground-glass opacities. There are areas of denser consolidation seen in the retrocardiac region and at the right lung base. Small left pleural effusion is suspected. No pneumothorax. Cardiac silhouette is unremarkable. IMPRESSION: 1. Interval development of multifocal bilateral airspace disease, greatest at the lung bases, consistent with bilateral pneumonia. 2. Small left pleural effusion, increased since prior study. Electronically Signed   By: Randa Ngo M.D.   On: 05/31/2022 17:34        Scheduled Meds:  memantine  28 mg Oral QHS   And   donepezil  10 mg Oral QHS   [START ON 06/03/2022] influenza vaccine adjuvanted  0.5 mL Intramuscular Tomorrow-1000   levalbuterol  0.63 mg Nebulization Q1400   loratadine  10 mg Oral Daily   methylPREDNISolone (SOLU-MEDROL) injection  40 mg Intravenous Daily   sertraline  75 mg Oral Daily   Continuous Infusions:  sodium chloride 125 mL/hr at 06/02/22 0825   azithromycin Stopped (06/02/22 0109)   cefTRIAXone (ROCEPHIN)  IV Stopped (06/01/22 2336)     LOS: 2 days    Time spent: 51 minutes spent on chart review, discussion with nursing staff, consultants, updating family and interview/physical exam; more than 50% of that time was spent in counseling and/or coordination of care.    Myha Arizpe J [name] DO [facility] Available via Epic secure chat 7am-7pm After these  hours, please refer to coverage provider listed on [email or web address] 06/02/2022, 12:33 PM

## 2022-06-02 NOTE — Procedures (Signed)
Patient Name: Beth Randall  MRN: [id number]  Epilepsy Attending: Lora Havens  Referring Physician/Provider: [name], DO  Date: 06/02/2022 Duration: 22.44 mins  Patient history: 88yo F with ams. EEG to evaluate for seizure.  Level of alertness: Awake, asleep  AEDs during EEG study: None  Technical aspects: This EEG study was done with scalp electrodes positioned according to the 10-20 International system of electrode placement. Electrical activity was reviewed with band pass filter of 1-'70Hz'$ , sensitivity of 7 uV/mm, display speed of 11m/sec with a '60Hz'$  notched filter applied as appropriate. EEG data were recorded continuously and digitally stored.  Video monitoring was available and reviewed as appropriate.  Description: The posterior dominant rhythm consists of '8Hz'$  activity of moderate voltage (25-35 uV) seen predominantly in posterior head regions, symmetric and reactive to eye opening and eye closing. Sleep was characterized by vertex waves, sleep spindles (12 to 14 Hz), maximal frontocentral region. EEG showed intermittent generalized 3 to 6 Hz theta-delta slowing. Hyperventilation and photic stimulation were not performed.     ABNORMALITY - Intermittent slow, generalized  IMPRESSION: This study is suggestive of mild diffuse encephalopathy, nonspecific etiology. No seizures or epileptiform discharges were seen throughout the recording.  Jaymarion Trombly OBarbra Sarks

## 2022-06-02 NOTE — Plan of Care (Signed)
  Problem: Respiratory: Goal: Ability to maintain adequate ventilation will improve Outcome: Progressing   Problem: Clinical Measurements: Goal: Respiratory complications will improve Outcome: Progressing   Problem: Clinical Measurements: Goal: Cardiovascular complication will be avoided Outcome: Progressing   Problem: Elimination: Goal: Will not experience complications related to bowel motility Outcome: Progressing   Problem: Elimination: Goal: Will not experience complications related to urinary retention Outcome: Progressing   Problem: Pain Managment: Goal: General experience of comfort will improve Outcome: Progressing   Problem: Safety: Goal: Ability to remain free from injury will improve Outcome: Progressing   Problem: Pain Managment: Goal: General experience of comfort will improve Outcome: Progressing

## 2022-06-02 NOTE — Progress Notes (Deleted)
PHARMACIST - PHYSICIAN COMMUNICATION  CONCERNING: Antibiotic IV to Oral Route Change Policy  RECOMMENDATION: This patient is receiving azithromycin by the intravenous route.  Based on criteria approved by the Pharmacy and Therapeutics Committee, the antibiotic(s) is/are being converted to the equivalent oral dose form(s).   DESCRIPTION: These criteria include: Patient being treated for a respiratory tract infection, urinary tract infection, cellulitis or clostridium difficile associated diarrhea if on metronidazole The patient is not neutropenic and does not exhibit a GI malabsorption state The patient is eating (either orally or via tube) and/or has been taking other orally administered medications for a least 24 hours The patient is improving clinically and has a Tmax < 100.5  If you have questions about this conversion, please contact the [place] 06/02/22

## 2022-06-03 DIAGNOSIS — J189 Pneumonia, unspecified organism: Secondary | ICD-10-CM | POA: Diagnosis not present

## 2022-06-03 LAB — CBC
HCT: 31.2 % — ABNORMAL LOW (ref 36.0–46.0)
Hemoglobin: 10.1 g/dL — ABNORMAL LOW (ref 12.0–15.0)
MCH: 28.8 pg (ref 26.0–34.0)
MCHC: 32.4 g/dL (ref 30.0–36.0)
MCV: 88.9 fL (ref 80.0–100.0)
Platelets: 278 10*3/uL (ref 150–400)
RBC: 3.51 MIL/uL — ABNORMAL LOW (ref 3.87–5.11)
RDW: 13.7 % (ref 11.5–15.5)
WBC: 11.7 10*3/uL — ABNORMAL HIGH (ref 4.0–10.5)
nRBC: 0 % (ref 0.0–0.2)

## 2022-06-03 LAB — BASIC METABOLIC PANEL
Anion gap: 5 (ref 5–15)
BUN: 17 mg/dL (ref 8–23)
CO2: 22 mmol/L (ref 22–32)
Calcium: 8.1 mg/dL — ABNORMAL LOW (ref 8.9–10.3)
Chloride: 115 mmol/L — ABNORMAL HIGH (ref 98–111)
Creatinine, Ser: 0.5 mg/dL (ref 0.44–1.00)
GFR, Estimated: >60 mL/min (ref >60)
Glucose, Bld: 92 mg/dL (ref 70–99)
Potassium: 3.7 mmol/L (ref 3.5–5.1)
Sodium: 142 mmol/L (ref 135–145)

## 2022-06-03 MED ORDER — LEVALBUTEROL HCL 0.63 MG/3ML IN NEBU
0.6300 mg | INHALATION_SOLUTION | Freq: Four times a day (QID) | RESPIRATORY_TRACT | Status: DC | PRN
  Administered 2022-06-03 – 2022-06-04 (×2): 0.63 mg via RESPIRATORY_TRACT
  Filled 2022-06-03 (×2): qty 3

## 2022-06-03 NOTE — Plan of Care (Signed)

## 2022-06-03 NOTE — Progress Notes (Signed)
PROGRESS NOTE    DAVENE JOBIN  NGE:[id number] DOB: [birth year] DOA: 05/31/2022 PCP: Baxter Hire, MD    Brief Narrative:   Beth Randall is a 88 y.o. female with past medical history significant for dementia, depression, breast cancer s/p mastectomy who presented to [facility] ED on 12/28 with progressive shortness of breath, cough over the last week.  Recently diagnosed with RSV on 12/23.  Family reports decreased oral intake.  She is bed bound/wheelchair dependent at baseline over the last 3 years.  In the ED, patient was noted to be hypoxic with SpO2 in the 80s, not oxygen dependent at baseline.  Temperature 97.9 F, HR 73, RR 18, BP 104/60.  Sodium 131, potassium 3.6, chloride 99, CO2 20, glucose 103, BUN 26, creatinine 0.54.  High sensitive troponin 18> 27.  WBC 16.1, hemoglobin 11.6, platelets 225.  INR 1.2.  COVID-19 PCR negative.  Procalcitonin 0.43, lactic acid 1.4.  Influenza A/B PCR negative.  RSV positive.  Chest x-ray with interval development of multifocal bilateral airspace disease, greatest at the lung bases consistent with bilateral pneumonia, small left pleural effusion.  Patient was given DuoNebs, IV Levaquin.  EDP consulted [facility] for admission for further evaluation and management of acute hypoxic respite failure secondary to multifocal pneumonia in the setting of RSV viral infection.  Assessment & Plan:   Acute hypoxic respiratory failure, POA Multifocal pneumonia RSV viral infection Acute metabolic encephalopathy Patient presenting with progressive shortness of breath, cough.  Recently diagnosed with RSV roughly 1 week prior.  Patient now with elevated WBC count of 16.1, hypoxic requiring 4 L nasal cannula on admission and procalcitonin 0.43, lactic acid 1.4.  Chest x-ray with findings of multifocal pneumonia.  MR brain without contrast with 2-3 mm thick subdural collections overlying both cerebral hemispheres likely reflective of chronic SDH without mass effect.  EEG with  no epileptiform discharges or seizure-like activity noted. --WBC 16.1>13.7>9.4>11.7 -- Azithromycin 500 mg IV every 24 hours x 5 days -- Ceftriaxone 2 g IV every 24 hours x 5 days -- Solu-Medrol 40 mg IV every 24 hours -- DuoNeb every 6 hours -- Xopenex neb PRN shortness of breath/wheezing -- Continue supplemental oxygen, maintain SpO2 greater than 92%, currently on 2 L nasal cannula with SpO2 95% at rest.  Not oxygen dependent at baseline. -- Supportive care, antitussives  Hyponatremia: Resolved Sodium 131 admission, likely hypovolemic hyponatremia in the setting of poor oral intake.  Supported with IV fluid hydration with improvement of sodium to 142, now resolved. -- Continue encourage increased oral intake -- NS at 50 mL/h -- Repeat BMP in a.m.  Dysphagia Son reports coughing while eating.  Seen by speech therapy -- Full liquid diet for now  Hypokalemia: Resolved Repleted, potassium 3.7 this morning. -- Repeat electrolytes in a.m.  Advanced dementia without behavioral disturbance --Delirium precautions --Get up during the day --Encourage a familiar face to remain present throughout the day --Keep blinds open and lights on during daylight hours --Minimize the use of opioids/benzodiazepines --Aricept 10 mg p.o. nightly --Namenda 20 mg p.o. nightly --Melatonin 3 mg p.o. nightly  Chronic SDH MR brain without contrast with 2-3 mm thick subdural collections overlying both cerebral hemispheres favored to reflect chronic subdural hematomas without mass effect or midline shift.  Patient's son reports previous head trauma likely correlating with findings.  Depression: -- Zoloft 75 mg p.o. daily  Chronic weakness/debility/deconditioning: Adult failure to thrive Currently resides with son and daughter-in-law.  Has been bedbound/wheelchair dependent over the last 3  years.  Patient remains with poor oral intake.  Discussed with patient's son and daughter-in-law that dementia is  terminal and she is likely nearing end-of-life; likely need to consider transitioning to hospice -- Supportive care, fall precautions. -- Palliative care consulted for assistance with goals of care and medical decision making   DVT prophylaxis: Place and maintain sequential compression device Start: 06/02/22 0732    Code Status: DNR Family Communication: Updated patient's son and daughter-in-law present at bedside this morning  Disposition Plan:  Level of care: Telemetry Medical Status is: Inpatient Remains inpatient appropriate because: IV antibiotics, needs titration off of supplemental oxygen as she is not dependent at baseline.  Anticipate discharge home in 1-2 days.  Palliative care consult pending, overall prognosis is extremely poor given terminal illness of advanced dementia and adult failure to thrive    Consultants:  Palliative care  Procedures:  None  Antimicrobials:  Levaquin 12/28 - 12/28 Azithromycin 12/28>> Ceftriaxone 12/28>>   Subjective: Patient seen examined bedside, resting calmly.  Son and daughter-in-law present.  Son reports that she ate most of her yogurt this morning.  Even though this is a slight improvement in terms of her oral intake, this will not meet her ultimate nutritional needs.  Discussed once again with son and daughter-in-law that her overall prognosis is extremely poor and that she is likely nearing the end of her life given her terminal diagnosis of advanced dementia.  Continue to encourage on return home to utilize hospice services.  Continue to await palliative care consultation.  No other questions or concerns at this time.  Unable to pain any further ROS from patient given her mental status/dementia.  No acute issues overnight per nursing staff.  Objective: Vitals:   06/02/22 1546 06/02/22 1956 06/03/22 0420 06/03/22 0936  BP: (!) 107/59 110/61 119/68 121/68  Pulse: 75 80 76 82  Resp: '17 18 17 17  '$ Temp: (!) 97 F (36.1 C) 98.3 F (36.8  C) 98.5 F (36.9 C) 98 F (36.7 C)  TempSrc: Oral Axillary Axillary Axillary  SpO2: 96% 97% 95% 92%  Weight:      Height:        Intake/Output Summary (Last 24 hours) at 06/03/2022 1244 Last data filed at 06/03/2022 0604 Gross per 24 hour  Intake 3196.85 ml  Output --  Net 3196.85 ml   Filed Weights   05/31/22 1637  Weight: 45.4 kg    Examination:  Physical Exam: GEN: NAD, alert, pleasantly confused/poorly responsive, elderly/chronically ill in appearance HEENT: NCAT, dry mucous membranes PULM: Breath sound diminished bilateral bases, mild crackles, no wheezing, normal respiratory effort without accessory muscle use, on 2 L nasal cannula with SpO2 95% at rest CV: RRR w/o M/G/R GI: abd soft, NTND, NABS, no R/G/M MSK: no peripheral edema, moves all EXTR independently Integumentary: No concerning wounds/rashes/lesions noted on exposed skin surfaces    Data Reviewed: I have personally reviewed following labs and imaging studies  CBC: Recent Labs  Lab 05/31/22 1828 06/01/22 0542 06/02/22 0454 06/03/22 0559  WBC 16.1* 13.7* 9.4 11.7*  HGB 11.6* 9.7* 9.8* 10.1*  HCT 35.8* 29.4* 30.0* 31.2*  MCV 90.6 88.8 89.3 88.9  PLT 225 204 215 784   Basic Metabolic Panel: Recent Labs  Lab 05/31/22 1828 06/01/22 0542 06/02/22 0454 06/03/22 0559  NA 131* 136 140 142  K 3.6 3.3* 4.3 3.7  CL 99 104 110 115*  CO2 20* '22 23 22  '$ GLUCOSE 103* 104* 110* 92  BUN 26* 21 14  17  CREATININE 0.54 0.50 0.56 0.50  CALCIUM 8.6* 7.9* 8.1* 8.1*  MG  --   --  2.0  --    GFR: Estimated Creatinine Clearance: 29.5 mL/min (by C-G formula based on SCr of 0.5 mg/dL). Liver Function Tests: No results for input(s): "AST", "ALT", "ALKPHOS", "BILITOT", "PROT", "ALBUMIN" in the last 168 hours. No results for input(s): "LIPASE", "AMYLASE" in the last 168 hours. No results for input(s): "AMMONIA" in the last 168 hours. Coagulation Profile: Recent Labs  Lab 05/31/22 2350 06/01/22 0542  INR 1.2  1.2   Cardiac Enzymes: No results for input(s): "CKTOTAL", "CKMB", "CKMBINDEX", "TROPONINI" in the last 168 hours. BNP (last 3 results) No results for input(s): "PROBNP" in the last 8760 hours. HbA1C: No results for input(s): "HGBA1C" in the last 72 hours. CBG: No results for input(s): "GLUCAP" in the last 168 hours. Lipid Profile: No results for input(s): "CHOL", "HDL", "LDLCALC", "TRIG", "CHOLHDL", "LDLDIRECT" in the last 72 hours. Thyroid Function Tests: No results for input(s): "TSH", "T4TOTAL", "FREET4", "T3FREE", "THYROIDAB" in the last 72 hours. Anemia Panel: No results for input(s): "VITAMINB12", "FOLATE", "FERRITIN", "TIBC", "IRON", "RETICCTPCT" in the last 72 hours. Sepsis Labs: Recent Labs  Lab 05/31/22 2350 05/31/22 2352 06/01/22 0246 06/01/22 0542 06/02/22 0454  PROCALCITON 0.50  --   --  0.43 0.28  LATICACIDVEN  --  1.3 1.4  --   --     Recent Results (from the past 240 hour(s))  Resp panel by RT-PCR (RSV, Flu A&B, Covid) Anterior Nasal Swab     Status: Abnormal   Collection Time: 05/26/22  2:56 PM   Specimen: Anterior Nasal Swab  Result Value Ref Range Status   SARS Coronavirus 2 by RT PCR NEGATIVE NEGATIVE Final    Comment: (NOTE) SARS-CoV-2 target nucleic acids are NOT DETECTED.  The SARS-CoV-2 RNA is generally detectable in upper respiratory specimens during the acute phase of infection. The lowest concentration of SARS-CoV-2 viral copies this assay can detect is 138 copies/mL. A negative result does not preclude SARS-Cov-2 infection and should not be used as the sole basis for treatment or other patient management decisions. A negative result may occur with  improper specimen collection/handling, submission of specimen other than nasopharyngeal swab, presence of viral mutation(s) within the areas targeted by this assay, and inadequate number of viral copies(<138 copies/mL). A negative result must be combined with clinical observations, patient  history, and epidemiological information. The expected result is Negative.  Fact Sheet for Patients:  EntrepreneurPulse.com.au  Fact Sheet for Healthcare Providers:  IncredibleEmployment.be  This test is no t yet approved or cleared by the [place] FDA and  has been authorized for detection and/or diagnosis of SARS-CoV-2 by FDA under an Emergency Use Authorization (EUA). This EUA will remain  in effect (meaning this test can be used) for the duration of the COVID-19 declaration under Section 564(b)(1) of the Act, 21 U.S.C.section 360bbb-3(b)(1), unless the authorization is terminated  or revoked sooner.       Influenza A by PCR NEGATIVE NEGATIVE Final   Influenza B by PCR NEGATIVE NEGATIVE Final    Comment: (NOTE) The Xpert Xpress SARS-CoV-2/FLU/RSV plus assay is intended as an aid in the diagnosis of influenza from Nasopharyngeal swab specimens and should not be used as a sole basis for treatment. Nasal washings and aspirates are unacceptable for Xpert Xpress SARS-CoV-2/FLU/RSV testing.  Fact Sheet for Patients: EntrepreneurPulse.com.au  Fact Sheet for Healthcare Providers: IncredibleEmployment.be  This test is not yet approved or cleared by  the [employer] and has been authorized for detection and/or diagnosis of SARS-CoV-2 by FDA under an Emergency Use Authorization (EUA). This EUA will remain in effect (meaning this test can be used) for the duration of the COVID-19 declaration under Section 564(b)(1) of the Act, 21 U.S.C. section 360bbb-3(b)(1), unless the authorization is terminated or revoked.     Resp Syncytial Virus by PCR POSITIVE (A) NEGATIVE Final    Comment: (NOTE) Fact Sheet for Patients: EntrepreneurPulse.com.au  Fact Sheet for Healthcare Providers: IncredibleEmployment.be  This test is not yet approved or cleared by the [place]  FDA and has been authorized for detection and/or diagnosis of SARS-CoV-2 by FDA under an Emergency Use Authorization (EUA). This EUA will remain in effect (meaning this test can be used) for the duration of the COVID-19 declaration under Section 564(b)(1) of the Act, 21 U.S.C. section 360bbb-3(b)(1), unless the authorization is terminated or revoked.  Performed at [facility], [address] [place], [place] [address]   Blood Culture (routine x 2)     Status: None (Preliminary result)   Collection Time: 05/31/22 11:50 PM   Specimen: BLOOD  Result Value Ref Range Status   Specimen Description BLOOD BLOOD RIGHT WRIST  Final   Special Requests   Final    BOTTLES DRAWN AEROBIC AND ANAEROBIC Blood Culture adequate volume   Culture   Final    NO GROWTH 2 DAYS Performed at [facility], [address], [place], [place] [address]    Report Status PENDING  Incomplete  Blood Culture (routine x 2)     Status: None (Preliminary result)   Collection Time: 05/31/22 11:50 PM   Specimen: BLOOD  Result Value Ref Range Status   Specimen Description BLOOD BLOOD RIGHT FOREARM  Final   Special Requests   Final    BOTTLES DRAWN AEROBIC AND ANAEROBIC Blood Culture adequate volume   Culture   Final    NO GROWTH 2 DAYS Performed at [facility], [address], [place], [place] [address]    Report Status PENDING  Incomplete  Resp panel by RT-PCR (RSV, Flu A&B, Covid) Anterior Nasal Swab     Status: Abnormal   Collection Time: 05/31/22 11:52 PM   Specimen: Anterior Nasal Swab  Result Value Ref Range Status   SARS Coronavirus 2 by RT PCR NEGATIVE NEGATIVE Final    Comment: (NOTE) SARS-CoV-2 target nucleic acids are NOT DETECTED.  The SARS-CoV-2 RNA is generally detectable in upper respiratory specimens during the acute phase of infection. The lowest concentration of SARS-CoV-2 viral copies this assay can detect is 138 copies/mL. A negative result does not preclude  SARS-Cov-2 infection and should not be used as the sole basis for treatment or other patient management decisions. A negative result may occur with  improper specimen collection/handling, submission of specimen other than nasopharyngeal swab, presence of viral mutation(s) within the areas targeted by this assay, and inadequate number of viral copies(<138 copies/mL). A negative result must be combined with clinical observations, patient history, and epidemiological information. The expected result is Negative.  Fact Sheet for Patients:  EntrepreneurPulse.com.au  Fact Sheet for Healthcare Providers:  IncredibleEmployment.be  This test is no t yet approved or cleared by the [place] FDA and  has been authorized for detection and/or diagnosis of SARS-CoV-2 by FDA under an Emergency Use Authorization (EUA). This EUA will remain  in effect (meaning this test can be used) for the duration of the COVID-19 declaration under Section 564(b)(1) of  the Act, 21 U.S.C.section 360bbb-3(b)(1), unless the authorization is terminated  or revoked sooner.       Influenza A by PCR NEGATIVE NEGATIVE Final   Influenza B by PCR NEGATIVE NEGATIVE Final    Comment: (NOTE) The Xpert Xpress SARS-CoV-2/FLU/RSV plus assay is intended as an aid in the diagnosis of influenza from Nasopharyngeal swab specimens and should not be used as a sole basis for treatment. Nasal washings and aspirates are unacceptable for Xpert Xpress SARS-CoV-2/FLU/RSV testing.  Fact Sheet for Patients: EntrepreneurPulse.com.au  Fact Sheet for Healthcare Providers: IncredibleEmployment.be  This test is not yet approved or cleared by the [place] FDA and has been authorized for detection and/or diagnosis of SARS-CoV-2 by FDA under an Emergency Use Authorization (EUA). This EUA will remain in effect (meaning this test can be used) for the duration of  the COVID-19 declaration under Section 564(b)(1) of the Act, 21 U.S.C. section 360bbb-3(b)(1), unless the authorization is terminated or revoked.     Resp Syncytial Virus by PCR POSITIVE (A) NEGATIVE Final    Comment: (NOTE) Fact Sheet for Patients: EntrepreneurPulse.com.au  Fact Sheet for Healthcare Providers: IncredibleEmployment.be  This test is not yet approved or cleared by the [place] FDA and has been authorized for detection and/or diagnosis of SARS-CoV-2 by FDA under an Emergency Use Authorization (EUA). This EUA will remain in effect (meaning this test can be used) for the duration of the COVID-19 declaration under Section 564(b)(1) of the Act, 21 U.S.C. section 360bbb-3(b)(1), unless the authorization is terminated or revoked.  Performed at [facility], [address], [place], [place] [address]          Radiology Studies: EEG adult  Result Date: 06-28-22 Lora Havens, MD     28-Jun-2022  6:41 AM Patient Name: JAKERIA CAISSIE MRN: [id number] Epilepsy Attending: Lora Havens Referring Physician/Provider: [name] Argie Applegate J, DO Date: 06-28-22 Duration: 22.44 mins Patient history: 88yo F with ams. EEG to evaluate for seizure. Level of alertness: Awake, asleep AEDs during EEG study: None Technical aspects: This EEG study was done with scalp electrodes positioned according to the 10-20 International system of electrode placement. Electrical activity was reviewed with band pass filter of 1-'70Hz'$ , sensitivity of 7 uV/mm, display speed of 70m/sec with a '60Hz'$  notched filter applied as appropriate. EEG data were recorded continuously and digitally stored.  Video monitoring was available and reviewed as appropriate. Description: The posterior dominant rhythm consists of '8Hz'$  activity of moderate voltage (25-35 uV) seen predominantly in posterior head regions, symmetric and reactive to eye opening and eye closing. Sleep was  characterized by vertex waves, sleep spindles (12 to 14 Hz), maximal frontocentral region. EEG showed intermittent generalized 3 to 6 Hz theta-delta slowing. Hyperventilation and photic stimulation were not performed.   ABNORMALITY - Intermittent slow, generalized IMPRESSION: This study is suggestive of mild diffuse encephalopathy, nonspecific etiology. No seizures or epileptiform discharges were seen throughout the recording. PLora Havens  MR BRAIN WO CONTRAST  Result Date: 06/01/2022 CLINICAL DATA:  History of dementia and breast cancer, presenting with dyspnea and cough. EXAM: MRI HEAD WITHOUT CONTRAST TECHNIQUE: Multiplanar, multiecho pulse sequences of the brain and surrounding structures were obtained without intravenous contrast. COMPARISON:  CT head 07/02/2020 FINDINGS: Brain: There is no evidence of acute infarct. There is a thin right cerebral convexity subdural collection measuring 2-3 mm, and a thin collection overlying the left frontal lobe also measuring 2-3 mm. The right collection was present on the CT head from 07/02/2020;  however, the left collection was not definitely seen on the study. There is no abnormal DWI signal abnormality or SWI signal dropout associated with the collections. There is no mass effect on the underlying brain parenchyma. There is marked parenchymal volume loss with prominence of the ventricular system and extra-axial CSF spaces with marked bilateral hippocampal atrophy. There is extensive FLAIR signal abnormality throughout the supratentorial white matter and pons consistent with advanced chronic small-vessel ischemic change. The pituitary and suprasellar region are normal. There is no mass lesion. There is no midline shift. Vascular: Normal flow voids. Skull and upper cervical spine: Normal marrow signal. Sinuses/Orbits: There is layering fluid in the left maxillary sinus. A left lens implant is in place. The globes and orbits are otherwise unremarkable. Other:  None. IMPRESSION: 1. 2-3 mm thick subdural collections overlying both cerebral hemispheres are favored to reflect chronic subdural hematomas, though the left collection is new since the prior head CT from 2022. No mass effect on the underlying brain parenchyma or midline shift. Consider short interval follow-up noncontrast head CT to ensure that the collection is not enlarged. 2. Advanced parenchymal volume loss and chronic small-vessel ischemic change. Electronically Signed   By: Valetta Mole M.D.   On: 06/01/2022 16:18        Scheduled Meds:  memantine  28 mg Oral QHS   And   donepezil  10 mg Oral QHS   influenza vaccine adjuvanted  0.5 mL Intramuscular Tomorrow-1000   loratadine  10 mg Oral Daily   methylPREDNISolone (SOLU-MEDROL) injection  40 mg Intravenous Daily   sertraline  75 mg Oral Daily   Continuous Infusions:  sodium chloride 50 mL/hr at 06/03/22 1131   azithromycin Stopped (06/03/22 0015)   cefTRIAXone (ROCEPHIN)  IV Stopped (06/02/22 2151)     LOS: 3 days    Time spent: 51 minutes spent on chart review, discussion with nursing staff, consultants, updating family and interview/physical exam; more than 50% of that time was spent in counseling and/or coordination of care.    Zeta Bucy J [name] DO [facility] Available via Epic secure chat 7am-7pm After these hours, please refer to coverage provider listed on [email or web address] 06/03/2022, 12:44 PM

## 2022-06-04 DIAGNOSIS — J189 Pneumonia, unspecified organism: Secondary | ICD-10-CM | POA: Diagnosis not present

## 2022-06-04 LAB — BASIC METABOLIC PANEL
Anion gap: 5 (ref 5–15)
BUN: 19 mg/dL (ref 8–23)
CO2: 25 mmol/L (ref 22–32)
Calcium: 8.2 mg/dL — ABNORMAL LOW (ref 8.9–10.3)
Chloride: 113 mmol/L — ABNORMAL HIGH (ref 98–111)
Creatinine, Ser: 0.59 mg/dL (ref 0.44–1.00)
GFR, Estimated: >60 mL/min (ref >60)
Glucose, Bld: 111 mg/dL — ABNORMAL HIGH (ref 70–99)
Potassium: 3.9 mmol/L (ref 3.5–5.1)
Sodium: 143 mmol/L (ref 135–145)

## 2022-06-04 LAB — CBC
HCT: 31.6 % — ABNORMAL LOW (ref 36.0–46.0)
Hemoglobin: 10.1 g/dL — ABNORMAL LOW (ref 12.0–15.0)
MCH: 28.8 pg (ref 26.0–34.0)
MCHC: 32 g/dL (ref 30.0–36.0)
MCV: 90 fL (ref 80.0–100.0)
Platelets: 269 10*3/uL (ref 150–400)
RBC: 3.51 MIL/uL — ABNORMAL LOW (ref 3.87–5.11)
RDW: 13.5 % (ref 11.5–15.5)
WBC: 10.2 10*3/uL (ref 4.0–10.5)
nRBC: 0 % (ref 0.0–0.2)

## 2022-06-04 MED ORDER — ENSURE ENLIVE PO LIQD
237.0000 mL | Freq: Two times a day (BID) | ORAL | Status: DC
  Administered 2022-06-05 – 2022-06-06 (×3): 237 mL via ORAL

## 2022-06-04 NOTE — TOC CM/SW Note (Signed)
TOC following for palliative/hospice plan.  [place] Scrape, [place]

## 2022-06-04 NOTE — Progress Notes (Signed)
PROGRESS NOTE    Beth Randall  JOI:[id number] DOB: 03-07-1924 DOA: 05/31/2022 PCP: Baxter Hire, MD    Brief Narrative:   Beth Randall is a 89 y.o. female with past medical history significant for dementia, depression, breast cancer s/p mastectomy who presented to [facility] ED on 12/28 with progressive shortness of breath, cough over the last week.  Recently diagnosed with RSV on 12/23.  Family reports decreased oral intake.  She is bed bound/wheelchair dependent at baseline over the last 3 years.  In the ED, patient was noted to be hypoxic with SpO2 in the 80s, not oxygen dependent at baseline.  Temperature 97.9 F, HR 73, RR 18, BP 104/60.  Sodium 131, potassium 3.6, chloride 99, CO2 20, glucose 103, BUN 26, creatinine 0.54.  High sensitive troponin 18> 27.  WBC 16.1, hemoglobin 11.6, platelets 225.  INR 1.2.  COVID-19 PCR negative.  Procalcitonin 0.43, lactic acid 1.4.  Influenza A/B PCR negative.  RSV positive.  Chest x-ray with interval development of multifocal bilateral airspace disease, greatest at the lung bases consistent with bilateral pneumonia, small left pleural effusion.  Patient was given DuoNebs, IV Levaquin.  EDP consulted [facility] for admission for further evaluation and management of acute hypoxic respite failure secondary to multifocal pneumonia in the setting of RSV viral infection.  Assessment & Plan:   Acute hypoxic respiratory failure, POA Multifocal pneumonia RSV viral infection Acute metabolic encephalopathy Patient presenting with progressive shortness of breath, cough.  Recently diagnosed with RSV roughly 1 week prior.  Patient now with elevated WBC count of 16.1, hypoxic requiring 4 L nasal cannula on admission and procalcitonin 0.43, lactic acid 1.4.  Chest x-ray with findings of multifocal pneumonia.  MR brain without contrast with 2-3 mm thick subdural collections overlying both cerebral hemispheres likely reflective of chronic SDH without mass effect.  EEG with  no epileptiform discharges or seizure-like activity noted. --WBC 16.1>13.7>9.4>11.7 -- Azithromycin 500 mg IV every 24 hours x 5 days -- Ceftriaxone 2 g IV every 24 hours x 5 days -- Solu-Medrol 40 mg IV every 24 hours -- DuoNeb every 6 hours -- Xopenex neb PRN shortness of breath/wheezing -- Continue supplemental oxygen, maintain SpO2 greater than 92%, currently on 2 L nasal cannula with SpO2 95% at rest.  Not oxygen dependent at baseline. -- Supportive care, antitussives  Hyponatremia: Resolved Sodium 131 admission, likely hypovolemic hyponatremia in the setting of poor oral intake.  Supported with IV fluid hydration with improvement of sodium to 143, now resolved. -- Continue encourage increased oral intake -- NS at 50 mL/h -- Repeat BMP in a.m.  Dysphagia Son reports coughing while eating.  Seen by speech therapy -- Full liquid diet for now  Hypokalemia: Resolved Repleted, potassium 3.9 this morning. -- Repeat electrolytes in a.m.  Advanced dementia without behavioral disturbance --Delirium precautions --Get up during the day --Encourage a familiar face to remain present throughout the day --Keep blinds open and lights on during daylight hours --Minimize the use of opioids/benzodiazepines --Aricept 10 mg p.o. nightly --Namenda 20 mg p.o. nightly --Melatonin 3 mg p.o. nightly  Chronic SDH MR brain without contrast with 2-3 mm thick subdural collections overlying both cerebral hemispheres favored to reflect chronic subdural hematomas without mass effect or midline shift.  Patient's son reports previous head trauma likely correlating with findings.  Depression: -- Zoloft 75 mg p.o. daily  Chronic weakness/debility/deconditioning: Adult failure to thrive Currently resides with son and daughter-in-law.  Has been bedbound/wheelchair dependent over the last 3  years.  Patient remains with poor oral intake.  Discussed with patient's son and daughter-in-law that dementia is  terminal and she is likely nearing end-of-life; likely need to consider transitioning to hospice -- Supportive care, fall precautions. -- Palliative care consulted for assistance with goals of care and medical decision making   DVT prophylaxis: Place and maintain sequential compression device Start: 06/02/22 0732    Code Status: DNR Family Communication: Updated patient's son and daughter-in-law present at bedside this morning  Disposition Plan:  Level of care: Telemetry Medical Status is: Inpatient Remains inpatient appropriate because: IV antibiotics, needs titration off of supplemental oxygen as she is not dependent at baseline.  Anticipate discharge home in 1-2 days.  Palliative care consult pending, overall prognosis is extremely poor given terminal illness of advanced dementia and adult failure to thrive.     Consultants:  Palliative care  Procedures:  None  Antimicrobials:  Levaquin 12/28 - 12/28 Azithromycin 12/28>> Ceftriaxone 12/28>>   Subjective: Patient seen examined bedside, resting calmly.  Son and daughter-in-law present.  No significant change.  Remains poorly interactive with poor oral intake.  Continue to discuss with family that she is failing to thrive and her oral intake is not sufficient to meet her nutritional needs.  Discussed patient is likely nearing end-of-life given her terminal illness of Alzheimer's dementia and likely need to consider hospice or at minimum outpatient palliative care to follow on discharge.  Continue to encourage on return home to utilize hospice services.  Continue to await palliative care consultation.  No other questions or concerns at this time.  Unable to pain any further ROS from patient given her mental status/dementia.  No acute issues overnight per nursing staff.  Objective: Vitals:   06/03/22 0936 06/03/22 1533 06/03/22 1952 06/04/22 0903  BP: 121/68  113/61 (!) 119/54  Pulse: 82  70 (!) 57  Resp: '17  16 16  '$ Temp: 98 F  (36.7 C)  99.3 F (37.4 C) 99 F (37.2 C)  TempSrc: Axillary  Oral Axillary  SpO2: 92% 92% 97% 97%  Weight:      Height:        Intake/Output Summary (Last 24 hours) at 06/04/2022 1253 Last data filed at 06/04/2022 1937 Gross per 24 hour  Intake 499.17 ml  Output --  Net 499.17 ml   Filed Weights   05/31/22 1637  Weight: 45.4 kg    Examination:  Physical Exam: GEN: NAD, alert, pleasantly confused/poorly responsive, elderly/chronically ill in appearance HEENT: NCAT, dry mucous membranes PULM: Breath sound diminished bilateral bases, mild crackles, no wheezing, normal respiratory effort without accessory muscle use, on 2 L nasal cannula with SpO2 95% at rest CV: RRR w/o M/G/R GI: abd soft, NTND, NABS, no R/G/M MSK: no peripheral edema, moves all EXTR independently Integumentary: No concerning wounds/rashes/lesions noted on exposed skin surfaces    Data Reviewed: I have personally reviewed following labs and imaging studies  CBC: Recent Labs  Lab 05/31/22 1828 06/01/22 0542 06/02/22 0454 06/03/22 0559 06/04/22 0403  WBC 16.1* 13.7* 9.4 11.7* 10.2  HGB 11.6* 9.7* 9.8* 10.1* 10.1*  HCT 35.8* 29.4* 30.0* 31.2* 31.6*  MCV 90.6 88.8 89.3 88.9 90.0  PLT 225 204 215 278 902   Basic Metabolic Panel: Recent Labs  Lab 05/31/22 1828 06/01/22 0542 06/02/22 0454 06/03/22 0559 06/04/22 0403  NA 131* 136 140 142 143  K 3.6 3.3* 4.3 3.7 3.9  CL 99 104 110 115* 113*  CO2 20* '22 23 22 '$ 25  GLUCOSE 103* 104* 110* 92 111*  BUN 26* '21 14 17 19  '$ CREATININE 0.54 0.50 0.56 0.50 0.59  CALCIUM 8.6* 7.9* 8.1* 8.1* 8.2*  MG  --   --  2.0  --   --    GFR: Estimated Creatinine Clearance: 29.5 mL/min (by C-G formula based on SCr of 0.59 mg/dL). Liver Function Tests: No results for input(s): "AST", "ALT", "ALKPHOS", "BILITOT", "PROT", "ALBUMIN" in the last 168 hours. No results for input(s): "LIPASE", "AMYLASE" in the last 168 hours. No results for input(s): "AMMONIA" in the last  168 hours. Coagulation Profile: Recent Labs  Lab 05/31/22 2350 06/01/22 0542  INR 1.2 1.2   Cardiac Enzymes: No results for input(s): "CKTOTAL", "CKMB", "CKMBINDEX", "TROPONINI" in the last 168 hours. BNP (last 3 results) No results for input(s): "PROBNP" in the last 8760 hours. HbA1C: No results for input(s): "HGBA1C" in the last 72 hours. CBG: No results for input(s): "GLUCAP" in the last 168 hours. Lipid Profile: No results for input(s): "CHOL", "HDL", "LDLCALC", "TRIG", "CHOLHDL", "LDLDIRECT" in the last 72 hours. Thyroid Function Tests: No results for input(s): "TSH", "T4TOTAL", "FREET4", "T3FREE", "THYROIDAB" in the last 72 hours. Anemia Panel: No results for input(s): "VITAMINB12", "FOLATE", "FERRITIN", "TIBC", "IRON", "RETICCTPCT" in the last 72 hours. Sepsis Labs: Recent Labs  Lab 05/31/22 2350 05/31/22 2352 06/01/22 0246 06/01/22 0542 06/02/22 0454  PROCALCITON 0.50  --   --  0.43 0.28  LATICACIDVEN  --  1.3 1.4  --   --     Recent Results (from the past 240 hour(s))  Resp panel by RT-PCR (RSV, Flu A&B, Covid) Anterior Nasal Swab     Status: Abnormal   Collection Time: 05/26/22  2:56 PM   Specimen: Anterior Nasal Swab  Result Value Ref Range Status   SARS Coronavirus 2 by RT PCR NEGATIVE NEGATIVE Final    Comment: (NOTE) SARS-CoV-2 target nucleic acids are NOT DETECTED.  The SARS-CoV-2 RNA is generally detectable in upper respiratory specimens during the acute phase of infection. The lowest concentration of SARS-CoV-2 viral copies this assay can detect is 138 copies/mL. A negative result does not preclude SARS-Cov-2 infection and should not be used as the sole basis for treatment or other patient management decisions. A negative result may occur with  improper specimen collection/handling, submission of specimen other than nasopharyngeal swab, presence of viral mutation(s) within the areas targeted by this assay, and inadequate number of viral copies(<138  copies/mL). A negative result must be combined with clinical observations, patient history, and epidemiological information. The expected result is Negative.  Fact Sheet for Patients:  EntrepreneurPulse.com.au  Fact Sheet for Healthcare Providers:  IncredibleEmployment.be  This test is no t yet approved or cleared by the [place] FDA and  has been authorized for detection and/or diagnosis of SARS-CoV-2 by FDA under an Emergency Use Authorization (EUA). This EUA will remain  in effect (meaning this test can be used) for the duration of the COVID-19 declaration under Section 564(b)(1) of the Act, 21 U.S.C.section 360bbb-3(b)(1), unless the authorization is terminated  or revoked sooner.       Influenza A by PCR NEGATIVE NEGATIVE Final   Influenza B by PCR NEGATIVE NEGATIVE Final    Comment: (NOTE) The Xpert Xpress SARS-CoV-2/FLU/RSV plus assay is intended as an aid in the diagnosis of influenza from Nasopharyngeal swab specimens and should not be used as a sole basis for treatment. Nasal washings and aspirates are unacceptable for Xpert Xpress SARS-CoV-2/FLU/RSV testing.  Fact Sheet for Patients: EntrepreneurPulse.com.au  Fact Sheet for Healthcare Providers: IncredibleEmployment.be  This test is not yet approved or cleared by the [place] FDA and has been authorized for detection and/or diagnosis of SARS-CoV-2 by FDA under an Emergency Use Authorization (EUA). This EUA will remain in effect (meaning this test can be used) for the duration of the COVID-19 declaration under Section 564(b)(1) of the Act, 21 U.S.C. section 360bbb-3(b)(1), unless the authorization is terminated or revoked.     Resp Syncytial Virus by PCR POSITIVE (A) NEGATIVE Final    Comment: (NOTE) Fact Sheet for Patients: EntrepreneurPulse.com.au  Fact Sheet for Healthcare  Providers: IncredibleEmployment.be  This test is not yet approved or cleared by the [place] FDA and has been authorized for detection and/or diagnosis of SARS-CoV-2 by FDA under an Emergency Use Authorization (EUA). This EUA will remain in effect (meaning this test can be used) for the duration of the COVID-19 declaration under Section 564(b)(1) of the Act, 21 U.S.C. section 360bbb-3(b)(1), unless the authorization is terminated or revoked.  Performed at [facility], [address] [place], [place] [address]   Blood Culture (routine x 2)     Status: None (Preliminary result)   Collection Time: 05/31/22 11:50 PM   Specimen: BLOOD  Result Value Ref Range Status   Specimen Description BLOOD BLOOD RIGHT WRIST  Final   Special Requests   Final    BOTTLES DRAWN AEROBIC AND ANAEROBIC Blood Culture adequate volume   Culture   Final    NO GROWTH 3 DAYS Performed at [facility], [address] [place], [place] [address]    Report Status PENDING  Incomplete  Blood Culture (routine x 2)     Status: None (Preliminary result)   Collection Time: 05/31/22 11:50 PM   Specimen: BLOOD  Result Value Ref Range Status   Specimen Description BLOOD BLOOD RIGHT FOREARM  Final   Special Requests   Final    BOTTLES DRAWN AEROBIC AND ANAEROBIC Blood Culture adequate volume   Culture   Final    NO GROWTH 3 DAYS Performed at [facility], [address] [place], [place] [address]    Report Status PENDING  Incomplete  Resp panel by RT-PCR (RSV, Flu A&B, Covid) Anterior Nasal Swab     Status: Abnormal   Collection Time: 05/31/22 11:52 PM   Specimen: Anterior Nasal Swab  Result Value Ref Range Status   SARS Coronavirus 2 by RT PCR NEGATIVE NEGATIVE Final    Comment: (NOTE) SARS-CoV-2 target nucleic acids are NOT DETECTED.  The SARS-CoV-2 RNA is generally detectable in upper respiratory specimens during the acute phase of infection. The  lowest concentration of SARS-CoV-2 viral copies this assay can detect is 138 copies/mL. A negative result does not preclude SARS-Cov-2 infection and should not be used as the sole basis for treatment or other patient management decisions. A negative result may occur with  improper specimen collection/handling, submission of specimen other than nasopharyngeal swab, presence of viral mutation(s) within the areas targeted by this assay, and inadequate number of viral copies(<138 copies/mL). A negative result must be combined with clinical observations, patient history, and epidemiological information. The expected result is Negative.  Fact Sheet for Patients:  EntrepreneurPulse.com.au  Fact Sheet for Healthcare Providers:  IncredibleEmployment.be  This test is no t yet approved or cleared by the [place] FDA and  has been authorized for detection and/or diagnosis of SARS-CoV-2 by FDA under an Emergency Use Authorization (EUA). This EUA will remain  in effect (meaning  this test can be used) for the duration of the COVID-19 declaration under Section 564(b)(1) of the Act, 21 U.S.C.section 360bbb-3(b)(1), unless the authorization is terminated  or revoked sooner.       Influenza A by PCR NEGATIVE NEGATIVE Final   Influenza B by PCR NEGATIVE NEGATIVE Final    Comment: (NOTE) The Xpert Xpress SARS-CoV-2/FLU/RSV plus assay is intended as an aid in the diagnosis of influenza from Nasopharyngeal swab specimens and should not be used as a sole basis for treatment. Nasal washings and aspirates are unacceptable for Xpert Xpress SARS-CoV-2/FLU/RSV testing.  Fact Sheet for Patients: EntrepreneurPulse.com.au  Fact Sheet for Healthcare Providers: IncredibleEmployment.be  This test is not yet approved or cleared by the [place] FDA and has been authorized for detection and/or diagnosis of SARS-CoV-2 by FDA under  an Emergency Use Authorization (EUA). This EUA will remain in effect (meaning this test can be used) for the duration of the COVID-19 declaration under Section 564(b)(1) of the Act, 21 U.S.C. section 360bbb-3(b)(1), unless the authorization is terminated or revoked.     Resp Syncytial Virus by PCR POSITIVE (A) NEGATIVE Final    Comment: (NOTE) Fact Sheet for Patients: EntrepreneurPulse.com.au  Fact Sheet for Healthcare Providers: IncredibleEmployment.be  This test is not yet approved or cleared by the [place] FDA and has been authorized for detection and/or diagnosis of SARS-CoV-2 by FDA under an Emergency Use Authorization (EUA). This EUA will remain in effect (meaning this test can be used) for the duration of the COVID-19 declaration under Section 564(b)(1) of the Act, 21 U.S.C. section 360bbb-3(b)(1), unless the authorization is terminated or revoked.  Performed at [facility], [address] [place], [place] [address]          Radiology Studies: No results found.      Scheduled Meds:  memantine  28 mg Oral QHS   And   donepezil  10 mg Oral QHS   influenza vaccine adjuvanted  0.5 mL Intramuscular Tomorrow-1000   loratadine  10 mg Oral Daily   methylPREDNISolone (SOLU-MEDROL) injection  40 mg Intravenous Daily   sertraline  75 mg Oral Daily   Continuous Infusions:  sodium chloride 50 mL/hr at 06/04/22 0611   azithromycin Stopped (06/03/22 2301)   cefTRIAXone (ROCEPHIN)  IV Stopped (06/03/22 2140)     LOS: 4 days    Time spent: 51 minutes spent on chart review, discussion with nursing staff, consultants, updating family and interview/physical exam; more than 50% of that time was spent in counseling and/or coordination of care.    Rangel Echeverri J [name] DO [facility] Available via Epic secure chat 7am-7pm After these hours, please refer to coverage provider listed on [email or web address] 06/04/2022, 12:53 PM

## 2022-06-05 ENCOUNTER — Encounter: Payer: Self-pay | Admitting: Family Medicine

## 2022-06-05 DIAGNOSIS — Z515 Encounter for palliative care: Secondary | ICD-10-CM

## 2022-06-05 DIAGNOSIS — Z7189 Other specified counseling: Secondary | ICD-10-CM | POA: Diagnosis not present

## 2022-06-05 DIAGNOSIS — J189 Pneumonia, unspecified organism: Secondary | ICD-10-CM | POA: Diagnosis not present

## 2022-06-05 DIAGNOSIS — F0390 Unspecified dementia without behavioral disturbance: Secondary | ICD-10-CM | POA: Diagnosis not present

## 2022-06-05 LAB — CBC
HCT: 31.4 % — ABNORMAL LOW (ref 36.0–46.0)
Hemoglobin: 10.2 g/dL — ABNORMAL LOW (ref 12.0–15.0)
MCH: 28.9 pg (ref 26.0–34.0)
MCHC: 32.5 g/dL (ref 30.0–36.0)
MCV: 89 fL (ref 80.0–100.0)
Platelets: 283 10*3/uL (ref 150–400)
RBC: 3.53 MIL/uL — ABNORMAL LOW (ref 3.87–5.11)
RDW: 13.6 % (ref 11.5–15.5)
WBC: 10.6 10*3/uL — ABNORMAL HIGH (ref 4.0–10.5)
nRBC: 0 % (ref 0.0–0.2)

## 2022-06-05 LAB — BASIC METABOLIC PANEL
Anion gap: 5 (ref 5–15)
BUN: 21 mg/dL (ref 8–23)
CO2: 26 mmol/L (ref 22–32)
Calcium: 8.1 mg/dL — ABNORMAL LOW (ref 8.9–10.3)
Chloride: 109 mmol/L (ref 98–111)
Creatinine, Ser: 0.55 mg/dL (ref 0.44–1.00)
GFR, Estimated: >60 mL/min (ref >60)
Glucose, Bld: 104 mg/dL — ABNORMAL HIGH (ref 70–99)
Potassium: 3.5 mmol/L (ref 3.5–5.1)
Sodium: 140 mmol/L (ref 135–145)

## 2022-06-05 MED ORDER — DEXTROSE-NACL 5-0.45 % IV SOLN: INTRAVENOUS | Status: DC

## 2022-06-05 NOTE — Progress Notes (Addendum)
PROGRESS NOTE    Beth Randall  JIR:[id number] DOB: [birth year] DOA: 05/31/2022 PCP: Baxter Hire, MD    Brief Narrative:   Beth Randall is a 89 y.o. female with past medical history significant for dementia, depression, breast cancer s/p mastectomy who presented to [facility] ED on 12/28 with progressive shortness of breath, cough over the last week.  Recently diagnosed with RSV on 12/23.  Family reports decreased oral intake.  She is bed bound/wheelchair dependent at baseline over the last 3 years.  In the ED, patient was noted to be hypoxic with SpO2 in the 80s, not oxygen dependent at baseline.  Temperature 97.9 F, HR 73, RR 18, BP 104/60.  Sodium 131, potassium 3.6, chloride 99, CO2 20, glucose 103, BUN 26, creatinine 0.54.  High sensitive troponin 18> 27.  WBC 16.1, hemoglobin 11.6, platelets 225.  INR 1.2.  COVID-19 PCR negative.  Procalcitonin 0.43, lactic acid 1.4.  Influenza A/B PCR negative.  RSV positive.  Chest x-ray with interval development of multifocal bilateral airspace disease, greatest at the lung bases consistent with bilateral pneumonia, small left pleural effusion.  Patient was given DuoNebs, IV Levaquin.  EDP consulted [facility] for admission for further evaluation and management of acute hypoxic respite failure secondary to multifocal pneumonia in the setting of RSV viral infection.  Assessment & Plan:   Acute hypoxic respiratory failure, POA Multifocal pneumonia RSV viral infection Acute metabolic encephalopathy Patient presenting with progressive shortness of breath, cough.  Recently diagnosed with RSV roughly 1 week prior.  Patient now with elevated WBC count of 16.1, hypoxic requiring 4 L nasal cannula on admission and procalcitonin 0.43, lactic acid 1.4.  Chest x-ray with findings of multifocal pneumonia.  MR brain without contrast with 2-3 mm thick subdural collections overlying both cerebral hemispheres likely reflective of chronic SDH without mass effect.  EEG with  no epileptiform discharges or seizure-like activity noted.  Completed 5-day course of antibiotics with azithromycin and ceftriaxone. --WBC 16.1>13.7>9.4>11.7>10.6 -- Solu-Medrol 40 mg IV every 24 hours -- DuoNeb every 6 hours -- Xopenex neb PRN shortness of breath/wheezing -- Continue supplemental oxygen, maintain SpO2 greater than 92%, currently on 2 L nasal cannula with SpO2 95% at rest.  Not oxygen dependent at baseline. -- Supportive care, antitussives  Hyponatremia: Resolved Sodium 131 admission, likely hypovolemic hyponatremia in the setting of poor oral intake.  Supported with IV fluid hydration with improvement of sodium to 143, now resolved. -- Continue encourage increased oral intake -- D5 half NS at 25 mL/h -- Repeat BMP in a.m.  Dysphagia Son reports coughing while eating.  Seen by speech therapy -- Full liquid diet for now  Hypokalemia: Resolved Repleted, potassium 3.5 this morning. -- Repeat electrolytes in a.m.  Advanced dementia without behavioral disturbance --Delirium precautions --Get up during the day --Encourage a familiar face to remain present throughout the day --Keep blinds open and lights on during daylight hours --Minimize the use of opioids/benzodiazepines --Aricept 10 mg p.o. nightly --Namenda 20 mg p.o. nightly --Melatonin 3 mg p.o. nightly  Chronic SDH MR brain without contrast with 2-3 mm thick subdural collections overlying both cerebral hemispheres favored to reflect chronic subdural hematomas without mass effect or midline shift.  Patient's son reports previous head trauma likely correlating with findings.  Depression: Family denies Zoloft use, discontinued.  Chronic weakness/debility/deconditioning: Adult failure to thrive Currently resides with son and daughter-in-law.  Has been bedbound/wheelchair dependent over the last 3 years.  Patient remains with poor oral intake.  Discussed with  patient's son and daughter-in-law that dementia is  terminal and she is likely nearing end-of-life; likely need to consider transitioning to hospice -- Supportive care, fall precautions. -- Palliative care consulted for assistance with goals of care and medical decision making   DVT prophylaxis: Place and maintain sequential compression device Start: 06/02/22 0732    Code Status: DNR Family Communication: Updated patient's son and daughter-in-law present at bedside this morning  Disposition Plan:  Level of care: Telemetry Medical Status is: Inpatient Remains inpatient appropriate because:  Palliative care consult pending, overall prognosis is extremely poor given terminal illness of advanced dementia and adult failure to thrive.  Continue to discuss likely need of home versus residential hospice on discharge.    Consultants:  Palliative care  Procedures:  None  Antimicrobials:  Levaquin 12/28 - 12/28 Azithromycin 12/28>> Ceftriaxone 12/28>>   Subjective: Patient seen and examined bedside, resting calmly.  Son and daughter-in-law present.  Completed 5-day course of IV antibiotics.  No significant change.  Remains poorly interactive with poor oral intake.  Continue to discuss with family that she is failing to thrive and her oral intake is not sufficient to meet her nutritional needs.  Discussed patient is likely nearing end-of-life given her terminal illness of Alzheimer's dementia and likely need to consider home versus residential hospice. Continue to await palliative care consultation, son requesting to talk with social work today.  No other questions or concerns at this time.  Unable to pain any further ROS from patient given her mental status/dementia.  No acute issues overnight per nursing staff.  Objective: Vitals:   06/04/22 1527 06/04/22 2047 06/04/22 2048 06/05/22 0348  BP: (!) 108/55 109/60  117/63  Pulse: 63 70 69 63  Resp: '17 20  14  '$ Temp: 98.9 F (37.2 C) 98.2 F (36.8 C)  98.3 F (36.8 C)  TempSrc: Axillary Oral   Oral  SpO2: 95%  97% 98%  Weight:      Height:        Intake/Output Summary (Last 24 hours) at 06/05/2022 1046 Last data filed at 06/04/2022 1659 Gross per 24 hour  Intake 540 ml  Output --  Net 540 ml   Filed Weights   05/31/22 1637  Weight: 45.4 kg    Examination:  Physical Exam: GEN: NAD, somnolent, poorly responsive, elderly/chronically ill in appearance HEENT: NCAT, dry mucous membranes PULM: Breath sound diminished bilateral bases, mild crackles, no wheezing, normal respiratory effort without accessory muscle use, on 2 L nasal cannula with SpO2 95% at rest CV: RRR w/o M/G/R GI: abd soft, NTND, NABS MSK: no peripheral edema Integumentary: No concerning wounds/rashes/lesions noted on exposed skin surfaces    Data Reviewed: I have personally reviewed following labs and imaging studies  CBC: Recent Labs  Lab 06/01/22 0542 06/02/22 0454 06/03/22 0559 06/04/22 0403 06/05/22 0511  WBC 13.7* 9.4 11.7* 10.2 10.6*  HGB 9.7* 9.8* 10.1* 10.1* 10.2*  HCT 29.4* 30.0* 31.2* 31.6* 31.4*  MCV 88.8 89.3 88.9 90.0 89.0  PLT 204 215 278 269 638   Basic Metabolic Panel: Recent Labs  Lab 06/01/22 0542 06/02/22 0454 06/03/22 0559 06/04/22 0403 06/05/22 0511  NA 136 140 142 143 140  K 3.3* 4.3 3.7 3.9 3.5  CL 104 110 115* 113* 109  CO2 '22 23 22 25 26  '$ GLUCOSE 104* 110* 92 111* 104*  BUN '21 14 17 19 21  '$ CREATININE 9.37 0.56 0.50 0.59 0.55  CALCIUM 7.9* 8.1* 8.1* 8.2* 8.1*  MG  --  2.0  --   --   --    GFR: Estimated Creatinine Clearance: 29.5 mL/min (by C-G formula based on SCr of 0.55 mg/dL). Liver Function Tests: No results for input(s): "AST", "ALT", "ALKPHOS", "BILITOT", "PROT", "ALBUMIN" in the last 168 hours. No results for input(s): "LIPASE", "AMYLASE" in the last 168 hours. No results for input(s): "AMMONIA" in the last 168 hours. Coagulation Profile: Recent Labs  Lab 05/31/22 2350 06/01/22 0542  INR 1.2 1.2   Cardiac Enzymes: No results for input(s):  "CKTOTAL", "CKMB", "CKMBINDEX", "TROPONINI" in the last 168 hours. BNP (last 3 results) No results for input(s): "PROBNP" in the last 8760 hours. HbA1C: No results for input(s): "HGBA1C" in the last 72 hours. CBG: No results for input(s): "GLUCAP" in the last 168 hours. Lipid Profile: No results for input(s): "CHOL", "HDL", "LDLCALC", "TRIG", "CHOLHDL", "LDLDIRECT" in the last 72 hours. Thyroid Function Tests: No results for input(s): "TSH", "T4TOTAL", "FREET4", "T3FREE", "THYROIDAB" in the last 72 hours. Anemia Panel: No results for input(s): "VITAMINB12", "FOLATE", "FERRITIN", "TIBC", "IRON", "RETICCTPCT" in the last 72 hours. Sepsis Labs: Recent Labs  Lab 05/31/22 2350 05/31/22 2352 06/01/22 0246 06/01/22 0542 06/02/22 0454  PROCALCITON 0.50  --   --  0.43 0.28  LATICACIDVEN  --  1.3 1.4  --   --     Recent Results (from the past 240 hour(s))  Resp panel by RT-PCR (RSV, Flu A&B, Covid) Anterior Nasal Swab     Status: Abnormal   Collection Time: 05/26/22  2:56 PM   Specimen: Anterior Nasal Swab  Result Value Ref Range Status   SARS Coronavirus 2 by RT PCR NEGATIVE NEGATIVE Final    Comment: (NOTE) SARS-CoV-2 target nucleic acids are NOT DETECTED.  The SARS-CoV-2 RNA is generally detectable in upper respiratory specimens during the acute phase of infection. The lowest concentration of SARS-CoV-2 viral copies this assay can detect is 138 copies/mL. A negative result does not preclude SARS-Cov-2 infection and should not be used as the sole basis for treatment or other patient management decisions. A negative result may occur with  improper specimen collection/handling, submission of specimen other than nasopharyngeal swab, presence of viral mutation(s) within the areas targeted by this assay, and inadequate number of viral copies(<138 copies/mL). A negative result must be combined with clinical observations, patient history, and epidemiological information. The expected  result is Negative.  Fact Sheet for Patients:  EntrepreneurPulse.com.au  Fact Sheet for Healthcare Providers:  IncredibleEmployment.be  This test is no t yet approved or cleared by the [place] FDA and  has been authorized for detection and/or diagnosis of SARS-CoV-2 by FDA under an Emergency Use Authorization (EUA). This EUA will remain  in effect (meaning this test can be used) for the duration of the COVID-19 declaration under Section 564(b)(1) of the Act, 21 U.S.C.section 360bbb-3(b)(1), unless the authorization is terminated  or revoked sooner.       Influenza A by PCR NEGATIVE NEGATIVE Final   Influenza B by PCR NEGATIVE NEGATIVE Final    Comment: (NOTE) The Xpert Xpress SARS-CoV-2/FLU/RSV plus assay is intended as an aid in the diagnosis of influenza from Nasopharyngeal swab specimens and should not be used as a sole basis for treatment. Nasal washings and aspirates are unacceptable for Xpert Xpress SARS-CoV-2/FLU/RSV testing.  Fact Sheet for Patients: EntrepreneurPulse.com.au  Fact Sheet for Healthcare Providers: IncredibleEmployment.be  This test is not yet approved or cleared by the [place] FDA and has been authorized for detection and/or diagnosis of SARS-CoV-2 by  FDA under an Emergency Use Authorization (EUA). This EUA will remain in effect (meaning this test can be used) for the duration of the COVID-19 declaration under Section 564(b)(1) of the Act, 21 U.S.C. section 360bbb-3(b)(1), unless the authorization is terminated or revoked.     Resp Syncytial Virus by PCR POSITIVE (A) NEGATIVE Final    Comment: (NOTE) Fact Sheet for Patients: EntrepreneurPulse.com.au  Fact Sheet for Healthcare Providers: IncredibleEmployment.be  This test is not yet approved or cleared by the [place] FDA and has been authorized for detection and/or  diagnosis of SARS-CoV-2 by FDA under an Emergency Use Authorization (EUA). This EUA will remain in effect (meaning this test can be used) for the duration of the COVID-19 declaration under Section 564(b)(1) of the Act, 21 U.S.C. section 360bbb-3(b)(1), unless the authorization is terminated or revoked.  Performed at [facility], [address] [place], [place] [address]   Blood Culture (routine x 2)     Status: None (Preliminary result)   Collection Time: 05/31/22 11:50 PM   Specimen: BLOOD  Result Value Ref Range Status   Specimen Description BLOOD BLOOD RIGHT WRIST  Final   Special Requests   Final    BOTTLES DRAWN AEROBIC AND ANAEROBIC Blood Culture adequate volume   Culture   Final    NO GROWTH 4 DAYS Performed at [facility], [address] [place], [place] [address]    Report Status PENDING  Incomplete  Blood Culture (routine x 2)     Status: None (Preliminary result)   Collection Time: 05/31/22 11:50 PM   Specimen: BLOOD  Result Value Ref Range Status   Specimen Description BLOOD BLOOD RIGHT FOREARM  Final   Special Requests   Final    BOTTLES DRAWN AEROBIC AND ANAEROBIC Blood Culture adequate volume   Culture   Final    NO GROWTH 4 DAYS Performed at [facility], [address] [place], [place] [address]    Report Status PENDING  Incomplete  Resp panel by RT-PCR (RSV, Flu A&B, Covid) Anterior Nasal Swab     Status: Abnormal   Collection Time: 05/31/22 11:52 PM   Specimen: Anterior Nasal Swab  Result Value Ref Range Status   SARS Coronavirus 2 by RT PCR NEGATIVE NEGATIVE Final    Comment: (NOTE) SARS-CoV-2 target nucleic acids are NOT DETECTED.  The SARS-CoV-2 RNA is generally detectable in upper respiratory specimens during the acute phase of infection. The lowest concentration of SARS-CoV-2 viral copies this assay can detect is 138 copies/mL. A negative result does not preclude SARS-Cov-2 infection and should not be used as the  sole basis for treatment or other patient management decisions. A negative result may occur with  improper specimen collection/handling, submission of specimen other than nasopharyngeal swab, presence of viral mutation(s) within the areas targeted by this assay, and inadequate number of viral copies(<138 copies/mL). A negative result must be combined with clinical observations, patient history, and epidemiological information. The expected result is Negative.  Fact Sheet for Patients:  EntrepreneurPulse.com.au  Fact Sheet for Healthcare Providers:  IncredibleEmployment.be  This test is no t yet approved or cleared by the [place] FDA and  has been authorized for detection and/or diagnosis of SARS-CoV-2 by FDA under an Emergency Use Authorization (EUA). This EUA will remain  in effect (meaning this test can be used) for the duration of the COVID-19 declaration under Section 564(b)(1) of the Act, 21 U.S.C.section 360bbb-3(b)(1), unless the authorization is terminated  or revoked sooner.  Influenza A by PCR NEGATIVE NEGATIVE Final   Influenza B by PCR NEGATIVE NEGATIVE Final    Comment: (NOTE) The Xpert Xpress SARS-CoV-2/FLU/RSV plus assay is intended as an aid in the diagnosis of influenza from Nasopharyngeal swab specimens and should not be used as a sole basis for treatment. Nasal washings and aspirates are unacceptable for Xpert Xpress SARS-CoV-2/FLU/RSV testing.  Fact Sheet for Patients: EntrepreneurPulse.com.au  Fact Sheet for Healthcare Providers: IncredibleEmployment.be  This test is not yet approved or cleared by the [place] FDA and has been authorized for detection and/or diagnosis of SARS-CoV-2 by FDA under an Emergency Use Authorization (EUA). This EUA will remain in effect (meaning this test can be used) for the duration of the COVID-19 declaration under Section 564(b)(1) of the  Act, 21 U.S.C. section 360bbb-3(b)(1), unless the authorization is terminated or revoked.     Resp Syncytial Virus by PCR POSITIVE (A) NEGATIVE Final    Comment: (NOTE) Fact Sheet for Patients: EntrepreneurPulse.com.au  Fact Sheet for Healthcare Providers: IncredibleEmployment.be  This test is not yet approved or cleared by the [place] FDA and has been authorized for detection and/or diagnosis of SARS-CoV-2 by FDA under an Emergency Use Authorization (EUA). This EUA will remain in effect (meaning this test can be used) for the duration of the COVID-19 declaration under Section 564(b)(1) of the Act, 21 U.S.C. section 360bbb-3(b)(1), unless the authorization is terminated or revoked.  Performed at [facility], [address] [place], [place] [address]          Radiology Studies: No results found.      Scheduled Meds:  memantine  28 mg Oral QHS   And   donepezil  10 mg Oral QHS   feeding supplement  237 mL Oral BID BM   influenza vaccine adjuvanted  0.5 mL Intramuscular Tomorrow-1000   loratadine  10 mg Oral Daily   methylPREDNISolone (SOLU-MEDROL) injection  40 mg Intravenous Daily   sertraline  75 mg Oral Daily   Continuous Infusions:  dextrose 5 % and 0.45% NaCl       LOS: 5 days    Time spent: 51 minutes spent on chart review, discussion with nursing staff, consultants, updating family and interview/physical exam; more than 50% of that time was spent in counseling and/or coordination of care.    Bennie Chirico J [name] DO [facility] Available via Epic secure chat 7am-7pm After these hours, please refer to coverage provider listed on [email or web address] 06/05/2022, 10:46 AM

## 2022-06-05 NOTE — Plan of Care (Signed)
  Problem: Fluid Volume: Goal: Hemodynamic stability will improve Outcome: Progressing   Problem: Clinical Measurements: Goal: Diagnostic test results will improve Outcome: Progressing Goal: Signs and symptoms of infection will decrease Outcome: Progressing   Problem: Respiratory: Goal: Ability to maintain adequate ventilation will improve Outcome: Progressing   Problem: Health Behavior/Discharge Planning: Goal: Ability to manage health-related needs will improve Outcome: Progressing   Problem: Nutrition: Goal: Adequate nutrition will be maintained Outcome: Progressing   Problem: Coping: Goal: Level of anxiety will decrease Outcome: Progressing   Problem: Elimination: Goal: Will not experience complications related to bowel motility Outcome: Progressing   Problem: Pain Managment: Goal: General experience of comfort will improve Outcome: Progressing   Problem: Safety: Goal: Ability to remain free from injury will improve Outcome: Progressing   Problem: Skin Integrity: Goal: Risk for impaired skin integrity will decrease Outcome: Progressing

## 2022-06-05 NOTE — Progress Notes (Signed)
Manufacturing engineer [facility]) Hospital Liaison Note   Received request from PMT provider/Tasha D., NP to contact family at bedside to explain hospice services [facility]). Spoke with son/William & provided extensive education for hospice services and provided information packet for them to reference if needed.  Family requested additional time to discuss options. MSW voiced understanding. All questions answered and no concerns voiced.   At this time, patient is not under review for [facility] services as family continues to have ongoing [place] discussions. MSW to touch base with patient/family on 1.3.24   [facility] information and contact numbers given to family & above information shared with TOC.   Please call with any questions/concerns.    Thank you for the opportunity to participate in this patient's care.   Daphene Calamity, MSW [facility] Liaison  [phone]

## 2022-06-05 NOTE — Consult Note (Signed)
Consultation Note Date: 06/05/2022   Patient Name: Beth Randall  DOB: 04-07-1924  MRN: [id number]  Age / Sex: 89 y.o., female  PCP: Baxter Hire, MD Referring Physician: [name] [name], DO  Reason for Consultation: Establishing goals of care  HPI/Patient Profile: 89 y.o. female  with past medical history of dementia, breast cancer status postmastectomy, dementia with bedbound/wheelchair-bound status for approximately 3 years admitted on 05/31/2022 with hypoxic respiratory failure, multifocal pneumonia, RSV.   Clinical Assessment and Goals of Care: I have reviewed medical records including EPIC notes, labs and imaging, received report from RN, assessed the patient.  Beth Randall is sitting up in bed.  She appears chronically ill and quite frail.  She is alert, being fed by her daughter-in-law, Tiana Loft.  She will briefly make, but not keep eye contact.  She does not respond to my questions.  She clearly cannot make her needs known.  Transition of care team arrives to discuss disposition options.  Beth Randall requests that we call her husband, Gwyndolyn Saxon.  Call to son, Unika Nazareno, to discuss diagnosis prognosis, [place], EOL wishes, disposition and options.  I introduced Palliative Medicine as specialized medical care for people living with serious illness. It focuses on providing relief from the symptoms and stress of a serious illness. The goal is to improve quality of life for both the patient and the family.  We discussed a brief life review of the patient.  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  She was widowed in 2016.  Gwyndolyn Saxon is her only child.  He shares that she has been bedbound/wheelchair-bound for approximately 3 years.  We then focused on their current illness.  We talk about RSV and pneumonia.  We talk about the treatment plan.  We talk about IV fluids and failure to thrive.  I share  that IV fluids did not change poor by mouth intake.  Natural disease trajectory and expectations at EOL were discussed.  Advanced directives, concepts specific to code status, artifical feeding and hydration, and rehospitalization were considered and discussed.  DNR verified.  Gwyndolyn Saxon shares that they don't think they would want a feeding tube.   Hospice and Palliative Care services outpatient were explained and offered.  Gwyndolyn Saxon tells me that his aunt had hospice about 10 years ago.  We talk about hospice care in the home, RN and aid visits.  At this point, Gwyndolyn Saxon states that they would not want at home hospice care.  He is however interested in discussing what residential hospice can provide.  I shared that people go to hospice building to "let nature take its course.  He states he understands.  Discussed the importance of continued conversation with family and the medical providers regarding overall plan of care and treatment options, ensuring decisions are within the context of the patient's values and GOCs.  Questions and concerns were addressed.  The family was encouraged to call with questions or concerns.  PMT will continue to support holistically.  Conference with attending, bedside nursing  staff, transition of care team, inpatient hospice representative related to patient condition, needs, goals of care, disposition.   HCPOA  NEXT OF KIN -son, Ross Hefferan    SUMMARY OF RECOMMENDATIONS   At this point continue to treat the treatable but no CPR or intubation Requesting discussions with [facility] for residential hospice placement   Code Status/Advance Care Planning: DNR -DNR/goldenrod form completed and placed on chart.  Symptom Management:  Per hospitalist, no additional needs at this time.  Palliative Prophylaxis:  Frequent Pain Assessment, Oral Care, and Turn Reposition  Additional Recommendations (Limitations, Scope, Preferences): No CPR or  intubation.  Psycho-social/Spiritual:  Desire for further Chaplaincy support:no Additional Recommendations: Caregiving  Support/Resources and Education on Hospice  Prognosis:  Unable to determine -weeks would not be surprising if family elects to focus on comfort and dignity, let nature take its course.  3 to 6 months at most would not be surprising based on chronic illness burden, sepsis with pneumonia, poor functional status being bedbound for 3+ years  Discharge Planning: To Be Determined      Primary Diagnoses: Present on Admission:  Multifocal pneumonia   I have reviewed the medical record, interviewed the patient and family, and examined the patient. The following aspects are pertinent.  Past Medical History:  Diagnosis Date   Breast cancer ([place])    Dementia ([place])    Social History   Socioeconomic History   Marital status: Married    Spouse name: Not on file   Number of children: Not on file   Years of education: Not on file   Highest education level: Not on file  Occupational History   Not on file  Tobacco Use   Smoking status: Never    Passive exposure: Never   Smokeless tobacco: Never  Substance and Sexual Activity   Alcohol use: No   Drug use: No   Sexual activity: Not Currently  Other Topics Concern   Not on file  Social History Narrative   Not on file   Social Determinants of Health   Financial Resource Strain: Not on file  Food Insecurity: No Food Insecurity (06/02/2022)   Hunger Vital Sign    Worried About Running Out of Food in the Last Year: Never true    Ran Out of Food in the Last Year: Never true  Transportation Needs: No Transportation Needs (06/02/2022)   PRAPARE - Hydrologist (Medical): No    Lack of Transportation (Non-Medical): No  Physical Activity: Not on file  Stress: Not on file  Social Connections: Not on file   Family History  Problem Relation Age of Onset   Hypertension Mother    Scheduled  Meds:  memantine  28 mg Oral QHS   And   donepezil  10 mg Oral QHS   feeding supplement  237 mL Oral BID BM   influenza vaccine adjuvanted  0.5 mL Intramuscular Tomorrow-1000   loratadine  10 mg Oral Daily   methylPREDNISolone (SOLU-MEDROL) injection  40 mg Intravenous Daily   Continuous Infusions:  dextrose 5 % and 0.45% NaCl 25 mL/hr at 06/05/22 1057   PRN Meds:.acetaminophen **OR** acetaminophen, benzonatate, fluticasone, levalbuterol, magnesium hydroxide, ondansetron **OR** ondansetron (ZOFRAN) IV, traZODone Medications Prior to Admission:  Prior to Admission medications   Medication Sig Start Date End Date Taking? Authorizing Provider  benzonatate (TESSALON PERLES) 100 MG capsule Take 1 capsule (100 mg total) by mouth 3 (three) times daily as needed for cough. 05/26/22 05/26/23 Yes Nance Pear,  MD  fluticasone (FLONASE) 50 MCG/ACT nasal spray Place 2 sprays into both nostrils daily. 09/02/21  Yes [provider]  NAMZARIC 28-10 MG CP24 Take 1 capsule by mouth daily. 07/27/21  Yes [provider]   Allergies  Allergen Reactions   Ibuprofen Rash    Tongue swelling   Aspirin    Penicillins     .Has patient had a PCN reaction causing immediate rash, facial/tongue/throat swelling, SOB or lightheadedness with hypotension: Unknown Has patient had a PCN reaction causing severe rash involving mucus membranes or skin necrosis: Unknown Has patient had a PCN reaction that required hospitalization: Unknown Has patient had a PCN reaction occurring within the last 10 years: Unknown If all of the above answers are "NO", then may proceed with Cephalosporin use.    Review of Systems  Unable to perform ROS: Acuity of condition    Physical Exam Vitals and nursing note reviewed.     Vital Signs: BP 117/63 (BP Location: Right Arm)   Pulse 63   Temp 98.3 F (36.8 C) (Oral)   Resp 14   Ht '5\' 5"'$  (1.651 m)   Wt 45.4 kg   SpO2 98%   BMI 16.66 kg/m  Pain Scale:  Faces POSS *See Group Information*: S-Acceptable,Sleep, easy to arouse Pain Score: 0-No pain   SpO2: SpO2: 98 % O2 Device:SpO2: 98 % O2 Flow Rate: .O2 Flow Rate (L/min): 2 L/min  IO: Intake/output summary:  Intake/Output Summary (Last 24 hours) at 06/05/2022 1443 Last data filed at 06/04/2022 1659 Gross per 24 hour  Intake 540 ml  Output --  Net 540 ml    LBM: Last BM Date : 06/01/22 Baseline Weight: Weight: 45.4 kg Most recent weight: Weight: 45.4 kg     Palliative Assessment/Data:     Time In: 1130 Time Out: 1245 Time Total: 75 minutes  Greater than 50%  of this time was spent counseling and coordinating care related to the above assessment and plan.  Signed by: Drue Novel, NP   Please contact Palliative Medicine Team phone at [phone] for questions and concerns.  For individual provider: See Shea Evans

## 2022-06-06 DIAGNOSIS — Z7189 Other specified counseling: Secondary | ICD-10-CM | POA: Diagnosis not present

## 2022-06-06 DIAGNOSIS — Z515 Encounter for palliative care: Secondary | ICD-10-CM | POA: Diagnosis not present

## 2022-06-06 DIAGNOSIS — J189 Pneumonia, unspecified organism: Secondary | ICD-10-CM | POA: Diagnosis not present

## 2022-06-06 DIAGNOSIS — F0390 Unspecified dementia without behavioral disturbance: Secondary | ICD-10-CM | POA: Diagnosis not present

## 2022-06-06 LAB — CBC
HCT: 34.7 % — ABNORMAL LOW (ref 36.0–46.0)
Hemoglobin: 10.9 g/dL — ABNORMAL LOW (ref 12.0–15.0)
MCH: 29 pg (ref 26.0–34.0)
MCHC: 31.4 g/dL (ref 30.0–36.0)
MCV: 92.3 fL (ref 80.0–100.0)
Platelets: 282 10*3/uL (ref 150–400)
RBC: 3.76 MIL/uL — ABNORMAL LOW (ref 3.87–5.11)
RDW: 13.4 % (ref 11.5–15.5)
WBC: 10.7 10*3/uL — ABNORMAL HIGH (ref 4.0–10.5)
nRBC: 0 % (ref 0.0–0.2)

## 2022-06-06 LAB — BASIC METABOLIC PANEL
Anion gap: 10 (ref 5–15)
BUN: 24 mg/dL — ABNORMAL HIGH (ref 8–23)
CO2: 22 mmol/L (ref 22–32)
Calcium: 8 mg/dL — ABNORMAL LOW (ref 8.9–10.3)
Chloride: 106 mmol/L (ref 98–111)
Creatinine, Ser: 0.53 mg/dL (ref 0.44–1.00)
GFR, Estimated: >60 mL/min (ref >60)
Glucose, Bld: 89 mg/dL (ref 70–99)
Potassium: 3.7 mmol/L (ref 3.5–5.1)
Sodium: 138 mmol/L (ref 135–145)

## 2022-06-06 LAB — CULTURE, BLOOD (ROUTINE X 2)
Culture: NO GROWTH
Culture: NO GROWTH
Special Requests: ADEQUATE
Special Requests: ADEQUATE

## 2022-06-06 LAB — MAGNESIUM: Magnesium: 1.9 mg/dL (ref 1.7–2.4)

## 2022-06-06 MED ORDER — ADULT MULTIVITAMIN W/MINERALS CH
1.0000 | ORAL_TABLET | Freq: Every day | ORAL | Status: DC
  Administered 2022-06-07 – 2022-06-08 (×2): 1 via ORAL
  Filled 2022-06-06 (×2): qty 1

## 2022-06-06 MED ORDER — ENSURE ENLIVE PO LIQD
237.0000 mL | Freq: Three times a day (TID) | ORAL | Status: DC
  Administered 2022-06-06 – 2022-06-08 (×6): 237 mL via ORAL

## 2022-06-06 MED ORDER — IPRATROPIUM-ALBUTEROL 0.5-2.5 (3) MG/3ML IN SOLN
3.0000 mL | Freq: Two times a day (BID) | RESPIRATORY_TRACT | Status: DC
  Administered 2022-06-06 – 2022-06-07 (×3): 3 mL via RESPIRATORY_TRACT
  Filled 2022-06-06 (×3): qty 3

## 2022-06-06 NOTE — Progress Notes (Signed)
Manufacturing engineer [facility]) Hospital Liaison Note   Family requested additional time to discuss options of taking patient home w. Services vs Hospice Home. MSW voiced understanding. All questions answered and no concerns voiced.    At this time, patient is not under review for [facility] services as family continues to have ongoing Daniel discussions. MSW to touch base with patient/family on 1.3.24   [facility] information and contact numbers given to family & above information shared with TOC.   Please call with any questions/concerns.    Thank you for the opportunity to participate in this patient's care.   Daphene Calamity, MSW [facility] Liaison  [phone]

## 2022-06-06 NOTE — Progress Notes (Signed)
Palliative: [name] continues to be acutely/chronically ill and frail.  Her son, Beth Randall, is present at bedside.  Meeting outside room with attending, Beth Randall, and Beth Randall.  We talk about Beth Randall's care.  We talk about what this time would look like and feel like for her.  At this point Beth Randall plans to take her home.  Equipment should be delivered 1/4 with a tentative discharge date of 1/5.  We talk about the benefits of "treat the treatable" hospice care.  We talk about preferred place of death.  Beth Randall states that his mother had always wanted to die in her home.  We talk about the benefits of hospice care, relationship building.  Conference with attending, bedside nursing staff, transition of care team, local hospice representative related to patient condition, needs, goals of care, disposition.  Plan: At this point continue to treat the treatable.  Home with the benefits of "treat the treatable" hospice care with [facility].  Requesting ambulance service for transport home. DNR/goldenrod form on chart.  87 minutes Beth Axe, NP Palliative medicine team Team phone [phone] Greater than 50% of this time was spent counseling and coordinating care related to the above assessment and plan.

## 2022-06-06 NOTE — Progress Notes (Signed)
PROGRESS NOTE    Beth Randall  TXM:[id number] DOB: [birth year] DOA: 05/31/2022 PCP: Baxter Hire, MD   Brief Narrative:   89 y.o. female with past medical history significant for dementia, depression, breast cancer s/p mastectomy who presented to [facility] ED on 12/28 with progressive shortness of breath, cough over the last week.  Recently diagnosed with RSV on 12/23.  Family reports decreased oral intake.  She is bed bound/wheelchair dependent at baseline over the last 3 years. RSV positive. Chest x-ray with interval development of multifocal bilateral airspace disease, greatest at the lung bases consistent with bilateral pneumonia, small left pleural effusion. Patient was given DuoNebs, IV Levaquin. EDP consulted [facility] for admission for further evaluation and management of acute hypoxic respite failure secondary to multifocal pneumonia in the setting of RSV viral infection.  During hospitalization patient completed 5-day course of azithromycin and Rocephin, was on Solu-Medrol.  Given overall poor quality of life, advanced dementia and ongoing medical issues palliative care team was consulted.  At this time likely transitioning patient to hospice.  Assessment & Plan:  Principal Problem:   Multifocal pneumonia Active Problems:   Sepsis due to pneumonia ([place])   RSV (acute bronchiolitis due to respiratory syncytial virus)   Depression   Acute respiratory failure with hypoxia ([facility])   Dementia without behavioral disturbance ([place])    Acute hypoxic respiratory failure, POA Multifocal pneumonia RSV viral infection Acute metabolic encephalopathy Patient presenting with progressive shortness of breath, cough.  Recently diagnosed with RSV roughly 1 week prior.  Patient now with elevated WBC count of 16.1, hypoxic requiring 4 L nasal cannula on admission and procalcitonin 0.43, lactic acid 1.4.  Chest x-ray with findings of multifocal pneumonia.  MR brain without contrast with 2-3 mm thick subdural  collections overlying both cerebral hemispheres likely reflective of chronic SDH without mass effect.  EEG with no epileptiform discharges or seizure-like activity noted.  Completed 5-day course of antibiotics with azithromycin and ceftriaxone.  Leukocytosis has resolved, will slowly plan on transitioning her off Solu-Medrol.  Bronchodilators scheduled and as needed, supplemental oxygen as needed.   Hyponatremia: Resolved Sodium 131 admission, likely hypovolemic hyponatremia in the setting of poor oral intake.  Resolved   Dysphagia Son reports coughing while eating.  Seen by speech therapy -- Full liquid diet   Hypokalemia: Resolved Replete as needed   Advanced dementia without behavioral disturbance Delirium precaution.  On Aricept, Namenda   Chronic SDH MR brain without contrast with 2-3 mm thick subdural collections overlying both cerebral hemispheres favored to reflect chronic subdural hematomas without mass effect or midline shift.  Patient's son reports previous head trauma likely correlating with findings.   Depression: Family denies Zoloft use, discontinued.   Chronic weakness/debility/deconditioning: Adult failure to thrive Currently resides with son and daughter-in-law.  Has been bedbound/wheelchair dependent over the last 3 years.  Patient remains with poor oral intake.  Discussed with patient's son and daughter-in-law that dementia is terminal and she is likely nearing end-of-life; likely need to consider transitioning to hospice -- Palliative care team is following     DVT prophylaxis: SCDs  Code Status: DNR Family Communication: Met with patient's son at bedside Disposition Plan:  Level of care: Telemetry Medical Status is: Inpatient Remains inpatient appropriate because: Ongoing discussions with palliative care regarding possibly transitioning her to hospice   Consultants:  Palliative care   Procedures:  None   Antimicrobials:  Levaquin 12/28 -  12/28 Azithromycin 12/28>> 1/1 Ceftriaxone 12/28>>1/1  Subjective: Patient seen and examined at bedside, no complaints.  I met with patient's son at bedside and had an extensive discussion regarding her care.  Son is agreement that patient should eventually be transition home with hospice over next 24-48 hours.  In the meantime he will work with palliative care/hospice services to get all the necessary equipment is delivered at home.   Examination:  General exam: Appears calm and comfortable, elderly frail.  Bilateral temporal wasting. Respiratory system: Bibasilar crackles Cardiovascular system: S1 & S2 heard, RRR. No JVD, murmurs, rubs, gallops or clicks. No pedal edema. Gastrointestinal system: Abdomen is nondistended, soft and nontender. No organomegaly or masses felt. Normal bowel sounds heard. Central nervous system: Alert and oriented. No focal neurological deficits. Extremities: Symmetric 4 x 5 power. Skin: No rashes, lesions or ulcers Psychiatry: Judgement and insight appear poor   Objective: Vitals:   06/04/22 2048 06/05/22 0348 06/05/22 2051 06/06/22 0513  BP:  117/63 (!) 103/50 127/63  Pulse: 69 63 71 65  Resp:  14  18  Temp:  98.3 F (36.8 C) 98.2 F (36.8 C)   TempSrc:  Oral Axillary   SpO2: 97% 98% 91% 97%  Weight:      Height:        Intake/Output Summary (Last 24 hours) at 06/06/2022 0834 Last data filed at 06/05/2022 1800 Gross per 24 hour  Intake 134.43 ml  Output --  Net 134.43 ml   Filed Weights   05/31/22 1637  Weight: 45.4 kg     Data Reviewed:   CBC: Recent Labs  Lab 06/02/22 0454 06/03/22 0559 06/04/22 0403 06/05/22 0511 06/06/22 0636  WBC 9.4 11.7* 10.2 10.6* 10.7*  HGB 9.8* 10.1* 10.1* 10.2* 10.9*  HCT 30.0* 31.2* 31.6* 31.4* 34.7*  MCV 89.3 88.9 90.0 89.0 92.3  PLT 215 278 269 283 585   Basic Metabolic Panel: Recent Labs  Lab 06/02/22 0454 06/03/22 0559 06/04/22 0403 06/05/22 0511 06/06/22 0636  NA 140 142 143 140  138  K 4.3 3.7 3.9 3.5 3.7  CL 110 115* 113* 109 106  CO2 _0 GLUCOSE 110* 92 111* 104* 89  BUN _1 24*  CREATININE 0.56 0.50 0.59 0.55 0.53  CALCIUM 8.1* 8.1* 8.2* 8.1* 8.0*  MG 2.0  --   --   --  1.9   GFR: Estimated Creatinine Clearance: 29.5 mL/min (by C-G formula based on SCr of 0.53 mg/dL). Liver Function Tests: No results for input(s): "AST", "ALT", "ALKPHOS", "BILITOT", "PROT", "ALBUMIN" in the last 168 hours. No results for input(s): "LIPASE", "AMYLASE" in the last 168 hours. No results for input(s): "AMMONIA" in the last 168 hours. Coagulation Profile: Recent Labs  Lab 05/31/22 2350 06/01/22 0542  INR 1.2 1.2   Cardiac Enzymes: No results for input(s): "CKTOTAL", "CKMB", "CKMBINDEX", "TROPONINI" in the last 168 hours. BNP (last 3 results) No results for input(s): "PROBNP" in the last 8760 hours. HbA1C: No results for input(s): "HGBA1C" in the last 72 hours. CBG: No results for input(s): "GLUCAP" in the last 168 hours. Lipid Profile: No results for input(s): "CHOL", "HDL", "LDLCALC", "TRIG", "CHOLHDL", "LDLDIRECT" in the last 72 hours. Thyroid Function Tests: No results for input(s): "TSH", "T4TOTAL", "FREET4", "T3FREE", "THYROIDAB" in the last 72 hours. Anemia Panel: No results for input(s): "VITAMINB12", "FOLATE", "FERRITIN", "TIBC", "IRON", "RETICCTPCT" in the last 72 hours. Sepsis Labs: Recent Labs  Lab 05/31/22 2350 05/31/22 2352 06/01/22 0246 06/01/22 0542 06/02/22 0454  PROCALCITON 0.50  --   --  0.43 0.28  LATICACIDVEN  --  1.3 1.4  --   --     Recent Results (from the past 240 hour(s))  Blood Culture (routine x 2)     Status: None   Collection Time: 05/31/22 11:50 PM   Specimen: BLOOD  Result Value Ref Range Status   Specimen Description BLOOD BLOOD RIGHT WRIST  Final   Special Requests   Final    BOTTLES DRAWN AEROBIC AND ANAEROBIC Blood Culture adequate volume   Culture   Final    NO GROWTH 5 DAYS Performed at [facility], [address] [place], [place] [address]    Report Status 06/06/2022 FINAL  Final  Blood Culture (routine x 2)     Status: None   Collection Time: 05/31/22 11:50 PM   Specimen: BLOOD  Result Value Ref Range Status   Specimen Description BLOOD BLOOD RIGHT FOREARM  Final   Special Requests   Final    BOTTLES DRAWN AEROBIC AND ANAEROBIC Blood Culture adequate volume   Culture   Final    NO GROWTH 5 DAYS Performed at [facility], [address], [place], [place] [address]    Report Status 06/06/2022 FINAL  Final  Resp panel by RT-PCR (RSV, Flu A&B, Covid) Anterior Nasal Swab     Status: Abnormal   Collection Time: 05/31/22 11:52 PM   Specimen: Anterior Nasal Swab  Result Value Ref Range Status   SARS Coronavirus 2 by RT PCR NEGATIVE NEGATIVE Final    Comment: (NOTE) SARS-CoV-2 target nucleic acids are NOT DETECTED.  The SARS-CoV-2 RNA is generally detectable in upper respiratory specimens during the acute phase of infection. The lowest concentration of SARS-CoV-2 viral copies this assay can detect is 138 copies/mL. A negative result does not preclude SARS-Cov-2 infection and should not be used as the sole basis for treatment or other patient management decisions. A negative result may occur with  improper specimen collection/handling, submission of specimen other than nasopharyngeal swab, presence of viral mutation(s) within the areas targeted by this assay, and inadequate number of viral copies(<138 copies/mL). A negative result must be combined with clinical observations, patient history, and epidemiological information. The expected result is Negative.  Fact Sheet for Patients:  EntrepreneurPulse.com.au  Fact Sheet for Healthcare Providers:  IncredibleEmployment.be  This test is no t yet approved or cleared by the [place] FDA and  has been authorized for detection and/or diagnosis of SARS-CoV-2 by FDA  under an Emergency Use Authorization (EUA). This EUA will remain  in effect (meaning this test can be used) for the duration of the COVID-19 declaration under Section 564(b)(1) of the Act, 21 U.S.C.section 360bbb-3(b)(1), unless the authorization is terminated  or revoked sooner.       Influenza A by PCR NEGATIVE NEGATIVE Final   Influenza B by PCR NEGATIVE NEGATIVE Final    Comment: (NOTE) The Xpert Xpress SARS-CoV-2/FLU/RSV plus assay is intended as an aid in the diagnosis of influenza from Nasopharyngeal swab specimens and should not be used as a sole basis for treatment. Nasal washings and aspirates are unacceptable for Xpert Xpress SARS-CoV-2/FLU/RSV testing.  Fact Sheet for Patients: EntrepreneurPulse.com.au  Fact Sheet for Healthcare Providers: IncredibleEmployment.be  This test is not yet approved or cleared by the [place] FDA and has been authorized for detection and/or diagnosis of SARS-CoV-2 by FDA under an Emergency Use Authorization (EUA). This EUA will remain in effect (meaning this test can be used) for the duration of the COVID-19 declaration  under Section 564(b)(1) of the Act, 21 U.S.C. section 360bbb-3(b)(1), unless the authorization is terminated or revoked.     Resp Syncytial Virus by PCR POSITIVE (A) NEGATIVE Final    Comment: (NOTE) Fact Sheet for Patients: EntrepreneurPulse.com.au  Fact Sheet for Healthcare Providers: IncredibleEmployment.be  This test is not yet approved or cleared by the [place] FDA and has been authorized for detection and/or diagnosis of SARS-CoV-2 by FDA under an Emergency Use Authorization (EUA). This EUA will remain in effect (meaning this test can be used) for the duration of the COVID-19 declaration under Section 564(b)(1) of the Act, 21 U.S.C. section 360bbb-3(b)(1), unless the authorization is terminated or revoked.  Performed at [facility], [address] [place], [place] [address]          Radiology Studies: No results found.      Scheduled Meds:  memantine  28 mg Oral QHS   And   donepezil  10 mg Oral QHS   feeding supplement  237 mL Oral BID BM   influenza vaccine adjuvanted  0.5 mL Intramuscular Tomorrow-1000   loratadine  10 mg Oral Daily   methylPREDNISolone (SOLU-MEDROL) injection  40 mg Intravenous Daily   Continuous Infusions:   LOS: 6 days   Time spent= 35 mins    Emiliya Chretien Arsenio Loader, MD [facility]  If 7PM-7AM, please contact night-coverage  06/06/2022, 8:34 AM

## 2022-06-06 NOTE — Progress Notes (Signed)
Initial Nutrition Assessment  DOCUMENTATION CODES:   Severe malnutrition in context of chronic illness  INTERVENTION:   Ensure Enlive po TID, each supplement provides 350 kcal and 20 grams of protein.  Magic cup TID with meals, each supplement provides 290 kcal and 9 grams of protein  MVI po daily   Assist with meals  Pt at high refeed risk; recommend monitor potassium, magnesium and phosphorus labs daily until stable  NUTRITION DIAGNOSIS:   Severe Malnutrition related to social / environmental circumstances as evidenced by severe fat depletion, severe muscle depletion.  GOAL:   Patient will meet greater than or equal to 90% of their needs  MONITOR:   PO intake, Supplement acceptance, Labs, Weight trends, Skin, I & O's  REASON FOR ASSESSMENT:   Malnutrition Screening Tool    ASSESSMENT:   89 y/o female with h/o depression, anxiety, breast cancer s/p mastectomy and dementia who is admitted with RSV, sepsis and PNA.  Visited pt's room today. Pt is unable to provide any history so history provided by pt's family at bedside. Family reports pt with poor oral intake at baseline. Pt eating mainly sips and bites in hospital. Pt requires full assist with meals. Family trying to feed pt ice chips at time of RD visit. Family reports that pt drinks strawberry Ensure. RD will add supplements and MVI. Pt is at high refeed risk. Per chart, pt appears weight stable pta. Palliative care following for GOC.   Medications reviewed and include: solu-medrol  Labs reviewed: K 3.7 wnl, BUN 24(H) Wbc- 10.7(H), Hgb 10.9(L), Hct 34.7(L)  NUTRITION - FOCUSED PHYSICAL EXAM:  Flowsheet Row Most Recent Value  Orbital Region Severe depletion  Upper Arm Region Moderate depletion  Thoracic and Lumbar Region Severe depletion  Buccal Region Moderate depletion  Temple Region Severe depletion  Clavicle Bone Region Moderate depletion  Clavicle and Acromion Bone Region Moderate depletion  Scapular  Bone Region Moderate depletion  Dorsal Hand Severe depletion  Patellar Region Moderate depletion  Anterior Thigh Region Moderate depletion  Posterior Calf Region Moderate depletion  Edema (RD Assessment) Mild  Hair Reviewed  Eyes Reviewed  Mouth Reviewed  Skin Reviewed  Nails Reviewed   Diet Order:   Diet Order             Diet full liquid Room service appropriate? Yes; Fluid consistency: Thin  Diet effective now                  EDUCATION NEEDS:   No education needs have been identified at this time  Skin:  Skin Assessment: Reviewed RN Assessment (ecchymosis)  Last BM:  12/29  Height:   Ht Readings from Last 1 Encounters:  05/31/22 '5\' 5"'$  (1.651 m)    Weight:   Wt Readings from Last 1 Encounters:  05/31/22 45.4 kg    Ideal Body Weight:  56.8 kg  BMI:  Body mass index is 16.66 kg/m.  Estimated Nutritional Needs:   Kcal:  1200-1400kcal/day  Protein:  60-70g/day  Fluid:  1.2-1.4L/day  Koleen Distance MS, RD, LDN Please refer to [facility] for RD and/or RD on-call/weekend/after hours pager

## 2022-06-07 DIAGNOSIS — J189 Pneumonia, unspecified organism: Secondary | ICD-10-CM | POA: Diagnosis not present

## 2022-06-07 LAB — BASIC METABOLIC PANEL
Anion gap: 7 (ref 5–15)
BUN: 22 mg/dL (ref 8–23)
CO2: 28 mmol/L (ref 22–32)
Calcium: 8.1 mg/dL — ABNORMAL LOW (ref 8.9–10.3)
Chloride: 103 mmol/L (ref 98–111)
Creatinine, Ser: 0.46 mg/dL (ref 0.44–1.00)
GFR, Estimated: >60 mL/min (ref >60)
Glucose, Bld: 96 mg/dL (ref 70–99)
Potassium: 3.4 mmol/L — ABNORMAL LOW (ref 3.5–5.1)
Sodium: 138 mmol/L (ref 135–145)

## 2022-06-07 LAB — MAGNESIUM: Magnesium: 2 mg/dL (ref 1.7–2.4)

## 2022-06-07 LAB — PHOSPHORUS: Phosphorus: 3.4 mg/dL (ref 2.5–4.6)

## 2022-06-07 MED ORDER — POTASSIUM CHLORIDE 20 MEQ PO PACK
40.0000 meq | PACK | Freq: Once | ORAL | Status: AC
  Administered 2022-06-07: 40 meq via ORAL
  Filled 2022-06-07: qty 2

## 2022-06-07 MED ORDER — PREDNISONE 20 MG PO TABS
40.0000 mg | ORAL_TABLET | Freq: Every day | ORAL | Status: AC
  Administered 2022-06-07 – 2022-06-08 (×2): 40 mg via ORAL
  Filled 2022-06-07 (×2): qty 2

## 2022-06-07 NOTE — Progress Notes (Signed)
PROGRESS NOTE    Beth Randall  PXT:[id number] DOB: Mar 08, 1924 DOA: 05/31/2022 PCP: Baxter Hire, MD   Brief Narrative:   89 y.o. female with past medical history significant for dementia, depression, breast cancer s/p mastectomy who presented to [facility] ED on 12/28 with progressive shortness of breath, cough over the last week.  Recently diagnosed with RSV on 12/23.  Family reports decreased oral intake.  She is bed bound/wheelchair dependent at baseline over the last 3 years. RSV positive. Chest x-ray with interval development of multifocal bilateral airspace disease, greatest at the lung bases consistent with bilateral pneumonia, small left pleural effusion. Patient was given DuoNebs, IV Levaquin. EDP consulted [facility] for admission for further evaluation and management of acute hypoxic respite failure secondary to multifocal pneumonia in the setting of RSV viral infection.  During hospitalization patient completed 5-day course of azithromycin and Rocephin, was on Solu-Medrol.  Given overall poor quality of life, advanced dementia and ongoing medical issues palliative care team was consulted.  At this time likely transitioning patient to hospice.  Assessment & Plan:  Principal Problem:   Multifocal pneumonia Active Problems:   Sepsis due to pneumonia ([place])   RSV (acute bronchiolitis due to respiratory syncytial virus)   Depression   Acute respiratory failure with hypoxia (HCC)   Dementia without behavioral disturbance ([place])    Acute hypoxic respiratory failure, POA Multifocal pneumonia RSV viral infection Acute metabolic encephalopathy Patient presenting with progressive shortness of breath, cough.  Recently diagnosed with RSV roughly 1 week prior.  Patient now with elevated WBC count of 16.1, hypoxic requiring 4 L nasal cannula on admission and procalcitonin 0.43, lactic acid 1.4.  Chest x-ray with findings of multifocal pneumonia.  MR brain without contrast with 2-3 mm thick subdural  collections overlying both cerebral hemispheres likely reflective of chronic SDH without mass effect.  EEG with no epileptiform discharges or seizure-like activity noted.  Completed 5-day course of antibiotics with azithromycin and ceftriaxone.  Bronchodilators scheduled and as needed.  Transition Solu-Medrol to prednisone for 2 more days   Hyponatremia: Resolved Sodium 131 admission, likely hypovolemic hyponatremia in the setting of poor oral intake.  Resolved   Dysphagia Son reports coughing while eating.  Seen by speech therapy -- Full liquid diet I will send recommended family can decide to do comfort feedings as well.   Hypokalemia: Replete as needed   Advanced dementia without behavioral disturbance Delirium precaution.  On Aricept, Namenda   Chronic SDH MR brain without contrast with 2-3 mm thick subdural collections overlying both cerebral hemispheres favored to reflect chronic subdural hematomas without mass effect or midline shift.  Patient's son reports previous head trauma likely correlating with findings.   Depression: Family denies Zoloft use, discontinued.   Chronic weakness/debility/deconditioning: Adult failure to thrive Currently resides with son and daughter-in-law.  Has been bedbound/wheelchair dependent over the last 3 years.  Patient remains with poor oral intake.  Discussed with patient's son and daughter-in-law that dementia is terminal and she is likely nearing end-of-life; likely need to consider transitioning to hospice -- Palliative care team is following     DVT prophylaxis: SCDs  Code Status: DNR Family Communication: Son at bedside 9 Disposition Plan:  Level of care: Telemetry Medical Status is: Inpatient Remains inpatient appropriate because: Ongoing discussions with palliative care regarding possibly transitioning her to hospice, tentative plans to transition her home tomorrow   Consultants:  Palliative care   Procedures:  None    Antimicrobials:  Levaquin 12/28 - 12/28  Azithromycin 12/28>> 1/1 Ceftriaxone 12/28>>1/1      Subjective: Seen and examined at bedside.  No complaints.  Eating some yogurt with the help of son   Examination: Constitutional: Not in acute distress.  Elderly frail cachectic Respiratory: Clear to auscultation bilaterally Cardiovascular: Normal sinus rhythm, no rubs Abdomen: Nontender nondistended good bowel sounds Musculoskeletal: No edema noted Skin: No rashes seen Neurologic: CN 2-12 grossly intact.  And nonfocal Psychiatric: Alert to name only.  Poor judgment and insight.   Objective: Vitals:   06/06/22 2051 06/06/22 2108 06/07/22 0344 06/07/22 0846  BP: 118/60  122/63 (!) 125/56  Pulse: 83  73 98  Resp: '15  16 16  '$ Temp: 98.3 F (36.8 C)  98.2 F (36.8 C) (!) 97.5 F (36.4 C)  TempSrc: Oral  Oral Oral  SpO2: 91% 90% 93% (!) 88%  Weight:      Height:        Intake/Output Summary (Last 24 hours) at 06/07/2022 0848 Last data filed at 06/07/2022 0430 Gross per 24 hour  Intake --  Output 1050 ml  Net -1050 ml   Filed Weights   05/31/22 1637  Weight: 45.4 kg     Data Reviewed:   CBC: Recent Labs  Lab 06/02/22 0454 06/03/22 0559 06/04/22 0403 06/05/22 0511 06/06/22 0636  WBC 9.4 11.7* 10.2 10.6* 10.7*  HGB 9.8* 10.1* 10.1* 10.2* 10.9*  HCT 30.0* 31.2* 31.6* 31.4* 34.7*  MCV 89.3 88.9 90.0 89.0 92.3  PLT 215 278 269 283 884   Basic Metabolic Panel: Recent Labs  Lab 06/02/22 0454 06/03/22 0559 06/04/22 0403 06/05/22 0511 06/06/22 0636 06/07/22 0701  NA 140 142 143 140 138 138  K 4.3 3.7 3.9 3.5 3.7 3.4*  CL 110 115* 113* 109 106 103  CO2 '23 22 25 26 22 28  '$ GLUCOSE 110* 92 111* 104* 89 96  BUN '14 17 19 21 '$ 24* 22  CREATININE 0.56 0.50 0.59 0.55 0.53 0.46  CALCIUM 8.1* 8.1* 8.2* 8.1* 8.0* 8.1*  MG 2.0  --   --   --  1.9 2.0  PHOS  --   --   --   --   --  3.4   GFR: Estimated Creatinine Clearance: 29.5 mL/min (by C-G formula based on SCr of 0.46  mg/dL). Liver Function Tests: No results for input(s): "AST", "ALT", "ALKPHOS", "BILITOT", "PROT", "ALBUMIN" in the last 168 hours. No results for input(s): "LIPASE", "AMYLASE" in the last 168 hours. No results for input(s): "AMMONIA" in the last 168 hours. Coagulation Profile: Recent Labs  Lab 05/31/22 2350 06/01/22 0542  INR 1.2 1.2   Cardiac Enzymes: No results for input(s): "CKTOTAL", "CKMB", "CKMBINDEX", "TROPONINI" in the last 168 hours. BNP (last 3 results) No results for input(s): "PROBNP" in the last 8760 hours. HbA1C: No results for input(s): "HGBA1C" in the last 72 hours. CBG: No results for input(s): "GLUCAP" in the last 168 hours. Lipid Profile: No results for input(s): "CHOL", "HDL", "LDLCALC", "TRIG", "CHOLHDL", "LDLDIRECT" in the last 72 hours. Thyroid Function Tests: No results for input(s): "TSH", "T4TOTAL", "FREET4", "T3FREE", "THYROIDAB" in the last 72 hours. Anemia Panel: No results for input(s): "VITAMINB12", "FOLATE", "FERRITIN", "TIBC", "IRON", "RETICCTPCT" in the last 72 hours. Sepsis Labs: Recent Labs  Lab 05/31/22 2350 05/31/22 2352 06/01/22 0246 06/01/22 0542 06/02/22 0454  PROCALCITON 0.50  --   --  0.43 0.28  LATICACIDVEN  --  1.3 1.4  --   --     Recent Results (from the past 240  hour(s))  Blood Culture (routine x 2)     Status: None   Collection Time: 05/31/22 11:50 PM   Specimen: BLOOD  Result Value Ref Range Status   Specimen Description BLOOD BLOOD RIGHT WRIST  Final   Special Requests   Final    BOTTLES DRAWN AEROBIC AND ANAEROBIC Blood Culture adequate volume   Culture   Final    NO GROWTH 5 DAYS Performed at [facility], [address] [place], [place] [address]    Report Status 06/06/2022 FINAL  Final  Blood Culture (routine x 2)     Status: None   Collection Time: 05/31/22 11:50 PM   Specimen: BLOOD  Result Value Ref Range Status   Specimen Description BLOOD BLOOD RIGHT FOREARM  Final   Special Requests    Final    BOTTLES DRAWN AEROBIC AND ANAEROBIC Blood Culture adequate volume   Culture   Final    NO GROWTH 5 DAYS Performed at [facility], [address] [place], [place] [address]    Report Status 06/06/2022 FINAL  Final  Resp panel by RT-PCR (RSV, Flu A&B, Covid) Anterior Nasal Swab     Status: Abnormal   Collection Time: 05/31/22 11:52 PM   Specimen: Anterior Nasal Swab  Result Value Ref Range Status   SARS Coronavirus 2 by RT PCR NEGATIVE NEGATIVE Final    Comment: (NOTE) SARS-CoV-2 target nucleic acids are NOT DETECTED.  The SARS-CoV-2 RNA is generally detectable in upper respiratory specimens during the acute phase of infection. The lowest concentration of SARS-CoV-2 viral copies this assay can detect is 138 copies/mL. A negative result does not preclude SARS-Cov-2 infection and should not be used as the sole basis for treatment or other patient management decisions. A negative result may occur with  improper specimen collection/handling, submission of specimen other than nasopharyngeal swab, presence of viral mutation(s) within the areas targeted by this assay, and inadequate number of viral copies(<138 copies/mL). A negative result must be combined with clinical observations, patient history, and epidemiological information. The expected result is Negative.  Fact Sheet for Patients:  EntrepreneurPulse.com.au  Fact Sheet for Healthcare Providers:  IncredibleEmployment.be  This test is no t yet approved or cleared by the [place] FDA and  has been authorized for detection and/or diagnosis of SARS-CoV-2 by FDA under an Emergency Use Authorization (EUA). This EUA will remain  in effect (meaning this test can be used) for the duration of the COVID-19 declaration under Section 564(b)(1) of the Act, 21 U.S.C.section 360bbb-3(b)(1), unless the authorization is terminated  or revoked sooner.       Influenza A by PCR  NEGATIVE NEGATIVE Final   Influenza B by PCR NEGATIVE NEGATIVE Final    Comment: (NOTE) The Xpert Xpress SARS-CoV-2/FLU/RSV plus assay is intended as an aid in the diagnosis of influenza from Nasopharyngeal swab specimens and should not be used as a sole basis for treatment. Nasal washings and aspirates are unacceptable for Xpert Xpress SARS-CoV-2/FLU/RSV testing.  Fact Sheet for Patients: EntrepreneurPulse.com.au  Fact Sheet for Healthcare Providers: IncredibleEmployment.be  This test is not yet approved or cleared by the [place] FDA and has been authorized for detection and/or diagnosis of SARS-CoV-2 by FDA under an Emergency Use Authorization (EUA). This EUA will remain in effect (meaning this test can be used) for the duration of the COVID-19 declaration under Section 564(b)(1) of the Act, 21 U.S.C. section 360bbb-3(b)(1), unless the authorization is terminated or revoked.     Resp Syncytial Virus  by PCR POSITIVE (A) NEGATIVE Final    Comment: (NOTE) Fact Sheet for Patients: EntrepreneurPulse.com.au  Fact Sheet for Healthcare Providers: IncredibleEmployment.be  This test is not yet approved or cleared by the [place] FDA and has been authorized for detection and/or diagnosis of SARS-CoV-2 by FDA under an Emergency Use Authorization (EUA). This EUA will remain in effect (meaning this test can be used) for the duration of the COVID-19 declaration under Section 564(b)(1) of the Act, 21 U.S.C. section 360bbb-3(b)(1), unless the authorization is terminated or revoked.  Performed at [facility], [address], [place], [place] [address]          Radiology Studies: No results found.      Scheduled Meds:  memantine  28 mg Oral QHS   And   donepezil  10 mg Oral QHS   feeding supplement  237 mL Oral TID BM   influenza vaccine adjuvanted  0.5 mL Intramuscular  Tomorrow-1000   ipratropium-albuterol  3 mL Nebulization BID   loratadine  10 mg Oral Daily   methylPREDNISolone (SOLU-MEDROL) injection  40 mg Intravenous Daily   multivitamin with minerals  1 tablet Oral Daily   Continuous Infusions:   LOS: 7 days   Time spent= 35 mins    Mirren Gest Arsenio Loader, MD [facility]  If 7PM-7AM, please contact night-coverage  06/07/2022, 8:48 AM

## 2022-06-07 NOTE — Progress Notes (Signed)
[facility]) Hospital Liaison Note  MSW contacted son/William to confirm that family would like to proceed with patient discharging with [facility] hospice services. Family receptive and patient under review by [facility] physician.    DME needs discussed. Patient has the following equipment in the home (Purchased privately): N/A Patient requests the following equipment for delivery: Hospital bed 02 Bedside table  Address verified and is correct in the chart. Gwyndolyn Saxon is the family member to contact to arrange time of equipment delivery.    Please send signed and completed DNR home with patient/family. Please provide prescriptions at discharge as needed to ensure ongoing symptom management.    [facility] information and contact numbers given to family & above information shared with TOC.   Please call with any questions/concerns.    Thank you for the opportunity to participate in this patient's care.   Daphene Calamity, MSW [facility] Hospital Liaison  [phone]

## 2022-06-07 NOTE — TOC Initial Note (Signed)
Transition of Care [facility]) - Initial/Assessment Note    Patient Details  Name: Beth Randall MRN: [id number] Date of Birth: 05-02-1924  [facility]) CM/SW Contact:    Candie Chroman, LCSW Phone Number: 06/07/2022, 11:31 AM  Clinical Narrative:  Met with son. Plan is to return home with hospice through [facility]. They are ordering DME. Son confirmed patient will need EMS transport at discharge. Address on facesheet is correct.                Expected Discharge Plan: Home w Hospice Care Barriers to Discharge: Other (must enter comment) (DME delivery)   Patient Goals and CMS Choice     Choice offered to / list presented to : Adult Children      Expected Discharge Plan and Services     Post Acute Care Choice: Durable Medical Equipment, Hospice Living arrangements for the past 2 months: Single Family Home                                      Prior Living Arrangements/Services Living arrangements for the past 2 months: Single Family Home Lives with:: Adult Children Patient language and need for interpreter reviewed:: Yes Do you feel safe going back to the place where you live?: Yes      Need for Family Participation in Patient Care: Yes (Comment) Care giver support system in place?: Yes (comment)   Criminal Activity/Legal Involvement Pertinent to Current Situation/Hospitalization: No - Comment as needed  Activities of Daily Living Home Assistive Devices/Equipment: Wheelchair, Bedside commode/3-in-1 ADL Screening (condition at time of admission) Patient's cognitive ability adequate to safely complete daily activities?: No Is the patient deaf or have difficulty hearing?: Yes Does the patient have difficulty seeing, even when wearing glasses/contacts?: No Does the patient have difficulty concentrating, remembering, or making decisions?: Yes Patient able to express need for assistance with ADLs?: No Does the patient have difficulty dressing or bathing?:  Yes Independently performs ADLs?: No Communication: Needs assistance Is this a change from baseline?: Pre-admission baseline Dressing (OT): Dependent Is this a change from baseline?: Pre-admission baseline Grooming: Dependent Is this a change from baseline?: Pre-admission baseline Feeding: Needs assistance Is this a change from baseline?: Pre-admission baseline Bathing: Dependent Is this a change from baseline?: Pre-admission baseline Toileting: Dependent Is this a change from baseline?: Pre-admission baseline In/Out Bed: Dependent Is this a change from baseline?: Pre-admission baseline Walks in Home: Dependent Is this a change from baseline?: Pre-admission baseline Does the patient have difficulty walking or climbing stairs?: Yes Weakness of Legs: Both Weakness of Arms/Hands: Both  Permission Sought/Granted Permission sought to share information with : Family Supports    Share Information with NAME: Rhaya Coale  Permission granted to share info w AGENCY: [place] granted to share info w Relationship: Son  Permission granted to share info w Contact Information: [phone]  Emotional Assessment Appearance:: Appears stated age Attitude/Demeanor/Rapport: Unable to Assess Affect (typically observed): Unable to Assess   Alcohol / Substance Use: Not Applicable Psych Involvement: No (comment)  Admission diagnosis:  RSV (acute bronchiolitis due to respiratory syncytial virus) [J21.0] Acute respiratory failure with hypoxia (HCC) [J96.01] Multifocal pneumonia [J18.9] Patient Active Problem List   Diagnosis Date Noted   Sepsis due to pneumonia ([place]) 06/01/2022   Depression 06/01/2022   Acute respiratory failure with hypoxia ([place]) 06/01/2022   RSV (acute bronchiolitis due to respiratory syncytial virus) 06/01/2022  Dementia without behavioral disturbance ([place]) 06/01/2022   Multifocal pneumonia 05/31/2022   Protein-calorie malnutrition, severe 02/04/2018    Sepsis ([place]) 02/02/2018   Ganglion cyst 07/02/2017   Epidermoid cyst 07/02/2017   Benign neoplasm of hand 07/02/2017   Onychomycosis 05/03/2017   Acute pain of left knee 04/15/2017   History of depression 03/29/2017   Sleep disturbance 06/28/2016   Poor balance 12/21/2015   Gait disturbance 12/21/2015   At high risk for falls 12/21/2015   Anxiety, generalized 12/21/2015   SSS (sick sinus syndrome) ([place]) 03/19/2014   Late onset Alzheimer's disease with behavioral disturbance ([place]) 06/10/2013   Insomnia 06/10/2013   PCP:  Baxter Hire, MD Pharmacy:   [id number] [place] [place] [place]Phone: [phone]: [phone] [id number] [address] [place]: [phone]: [phone]   Social Determinants of Health ([place] History: S[place] Food Insecurity (06/02/2022)  Housing: Low Risk  (06/02/2022)  Transportation Needs: No Transportation Needs (06/02/2022)  Utilities: Not At Risk (06/02/2022)  Tobacco Use: Low Risk  (06/05/2022)   SDOH Interventions: Housing Interventions: Intervention Not Indicated   Readmission Risk Interventions     No data to display

## 2022-06-07 NOTE — Care Management Important Message (Signed)
Important Message  Patient Details  Name: Beth Randall MRN: [id number] Date of Birth: [birth year]   Medicare Important Message Given:  Other (see comment)  Patient is going home with hospice. Out of respect for the patient and family no Important Message from [facility] given.   Juliann Pulse A Eaven Schwager 06/07/2022, 3:14 PM

## 2022-06-08 DIAGNOSIS — J189 Pneumonia, unspecified organism: Secondary | ICD-10-CM | POA: Diagnosis not present

## 2022-06-08 LAB — MAGNESIUM: Magnesium: 2 mg/dL (ref 1.7–2.4)

## 2022-06-08 LAB — PHOSPHORUS: Phosphorus: 3.1 mg/dL (ref 2.5–4.6)

## 2022-06-08 MED ORDER — ALPRAZOLAM 0.25 MG PO TABS: 0.2500 mg | ORAL_TABLET | Freq: Two times a day (BID) | ORAL | 0 refills | Status: AC | PRN

## 2022-06-08 MED ORDER — PREDNISONE 20 MG PO TABS: 40.0000 mg | ORAL_TABLET | Freq: Every day | ORAL | 0 refills | Status: AC

## 2022-06-08 NOTE — Discharge Summary (Signed)
Physician Discharge Summary  Beth Randall DXI:[id number] DOB: Mar 29, 1924 DOA: 05/31/2022  PCP: Baxter Hire, MD  Admit date: 05/31/2022 Discharge date: 06/08/2022  Admitted From: Home Disposition:  Home with Hospice  Recommendations for Outpatient Follow-up:  Follow up with PCP in 1-2 weeks As needed Xanax prescribed Comfort feeding at home 2 more days of oral prednisone at home   Discharge Condition: Stable CODE STATUS: DNR Diet recommendation: Comfort for  Brief/Interim Summary: 89 y.o. female with past medical history significant for dementia, depression, breast cancer s/p mastectomy who presented to [facility] ED on 12/28 with progressive shortness of breath, cough over the last week.  Recently diagnosed with RSV on 12/23.  Family reports decreased oral intake.  She is bed bound/wheelchair dependent at baseline over the last 3 years. RSV positive. Chest x-ray with interval development of multifocal bilateral airspace disease, greatest at the lung bases consistent with bilateral pneumonia, small left pleural effusion. Patient was given DuoNebs, IV Levaquin. EDP consulted [facility] for admission for further evaluation and management of acute hypoxic respite failure secondary to multifocal pneumonia in the setting of RSV viral infection.  During hospitalization patient completed 5-day course of azithromycin and Rocephin, was on Solu-Medrol.  Given overall poor quality of life, advanced dementia and ongoing medical issues palliative care team was consulted.  At this time likely transitioning patient to hospice. Hopefully equipment will be delivered at the house today.  Transition patient home.   Assessment & Plan:  Principal Problem:   Multifocal pneumonia Active Problems:   Sepsis due to pneumonia ([place])   RSV (acute bronchiolitis due to respiratory syncytial virus)   Depression   Acute respiratory failure with hypoxia (HCC)   Dementia without behavioral disturbance ([place])     Acute  hypoxic respiratory failure, POA Multifocal pneumonia RSV viral infection Acute metabolic encephalopathy Patient presenting with progressive shortness of breath, cough.  Recently diagnosed with RSV roughly 1 week prior.  Patient now with elevated WBC count of 16.1, hypoxic requiring 4 L nasal cannula on admission and procalcitonin 0.43, lactic acid 1.4.  Chest x-ray with findings of multifocal pneumonia.  MR brain without contrast with 2-3 mm thick subdural collections overlying both cerebral hemispheres likely reflective of chronic SDH without mass effect.  EEG with no epileptiform discharges or seizure-like activity noted.  Completed 5-day course of antibiotics with azithromycin and ceftriaxone.  Bronchodilators scheduled and as needed.  2 more days of oral prednisone   Hyponatremia: Resolved Sodium 131 admission, likely hypovolemic hyponatremia in the setting of poor oral intake.  Resolved   Dysphagia Son reports coughing while eating.  Seen by speech therapy -- I personally recommended comfort feeding at home.  She is at high risk of aspiration.   Hypokalemia: Replete as needed   Advanced dementia without behavioral disturbance Delirium precaution.  On Aricept, Namenda, I opted to discontinue this   Chronic SDH MR brain without contrast with 2-3 mm thick subdural collections overlying both cerebral hemispheres favored to reflect chronic subdural hematomas without mass effect or midline shift.  Patient's son reports previous head trauma likely correlating with findings.   Depression: Family denies Zoloft use, discontinued.   Chronic weakness/debility/deconditioning: Adult failure to thrive Overall poor prognosis given her comorbidities, advanced age.  Transitioning patient home with hospice      Discharge Diagnoses:  Principal Problem:   Multifocal pneumonia Active Problems:   Sepsis due to pneumonia ([place])   RSV (acute bronchiolitis due to respiratory syncytial virus)    Depression  Acute respiratory failure with hypoxia (HCC)   Dementia without behavioral disturbance [facility])      Consultations: Palliative care  Subjective: Eating with the help of son.  No other complaints.  Discharge Exam: Vitals:   06/08/22 0840 06/08/22 0854  BP: (!) 112/57 (!) 118/58  Pulse: 78 78  Resp: 16 16  Temp: (!) 97.5 F (36.4 C) 98.3 F (36.8 C)  SpO2: 92% 93%   Vitals:   06/07/22 2033 06/08/22 0534 06/08/22 0840 06/08/22 0854  BP: 104/65 116/69 (!) 112/57 (!) 118/58  Pulse: 73 80 78 78  Resp: '18 16 16 16  '$ Temp: 97.8 F (36.6 C) 98.2 F (36.8 C) (!) 97.5 F (36.4 C) 98.3 F (36.8 C)  TempSrc: Oral Oral Oral Oral  SpO2: 94% 90% 92% 93%  Weight:      Height:        General: Pt is alert, awake, not in acute distress.  Elderly frail.  Cachectic.  Bilateral temporal wasting. Cardiovascular: RRR, S1/S2 +, no rubs, no gallops Respiratory: CTA bilaterally, no wheezing, no rhonchi Abdominal: Soft, NT, ND, bowel sounds + Extremities: no edema, no cyanosis  Discharge Instructions   Allergies as of 06/08/2022       Reactions   Ibuprofen Rash   Tongue swelling   Aspirin    Penicillins    .Has patient had a PCN reaction causing immediate rash, facial/tongue/throat swelling, SOB or lightheadedness with hypotension: Unknown Has patient had a PCN reaction causing severe rash involving mucus membranes or skin necrosis: Unknown Has patient had a PCN reaction that required hospitalization: Unknown Has patient had a PCN reaction occurring within the last 10 years: Unknown If all of the above answers are "NO", then may proceed with Cephalosporin use.        Medication List     TAKE these medications    ALPRAZolam 0.25 MG tablet Commonly known as: Xanax Take 1 tablet (0.25 mg total) by mouth 2 (two) times daily as needed for up to 10 days for anxiety.   benzonatate 100 MG capsule Commonly known as: Tessalon Perles Take 1 capsule (100 mg total) by mouth  3 (three) times daily as needed for cough.   fluticasone 50 MCG/ACT nasal spray Commonly known as: FLONASE Place 2 sprays into both nostrils daily.   Namzaric 28-10 MG Cp24 Generic drug: Memantine HCl-Donepezil HCl Take 1 capsule by mouth daily.   predniSONE 20 MG tablet Commonly known as: DELTASONE Take 2 tablets (40 mg total) by mouth daily with breakfast for 2 days.        Follow-up Information     Baxter Hire, MD. Call in 1 week(s).   Specialty: Internal Medicine Why: unable to make appt.   patient will need to call and make appt. themselves Contact information: [place] [address] [phone]                Allergies  Allergen Reactions   Ibuprofen Rash    Tongue swelling   Aspirin    Penicillins     .Has patient had a PCN reaction causing immediate rash, facial/tongue/throat swelling, SOB or lightheadedness with hypotension: Unknown Has patient had a PCN reaction causing severe rash involving mucus membranes or skin necrosis: Unknown Has patient had a PCN reaction that required hospitalization: Unknown Has patient had a PCN reaction occurring within the last 10 years: Unknown If all of the above answers are "NO", then may proceed with Cephalosporin use.  You were cared for by a hospitalist during your hospital stay. If you have any questions about your discharge medications or the care you received while you were in the hospital after you are discharged, you can call the unit and asked to speak with the hospitalist on call if the hospitalist that took care of you is not available. Once you are discharged, your primary care physician will handle any further medical issues. Please note that no refills for any discharge medications will be authorized once you are discharged, as it is imperative that you return to your primary care physician (or establish a relationship with a primary care physician if you do not have one) for your  aftercare needs so that they can reassess your need for medications and monitor your lab values.   Procedures/Studies: EEG adult  Result Date: Jun 19, 2022 Lora Havens, MD     June 19, 2022  6:41 AM Patient Name: KYARRA VANCAMP MRN: [id number] Epilepsy Attending: Lora Havens Referring Physician/Provider: [name] [name], DO Date: 19-Jun-2022 Duration: 22.44 mins Patient history: 89yo F with ams. EEG to evaluate for seizure. Level of alertness: Awake, asleep AEDs during EEG study: None Technical aspects: This EEG study was done with scalp electrodes positioned according to the 10-20 International system of electrode placement. Electrical activity was reviewed with band pass filter of 1-'70Hz'$ , sensitivity of 7 uV/mm, display speed of 82m/sec with a '60Hz'$  notched filter applied as appropriate. EEG data were recorded continuously and digitally stored.  Video monitoring was available and reviewed as appropriate. Description: The posterior dominant rhythm consists of '8Hz'$  activity of moderate voltage (25-35 uV) seen predominantly in posterior head regions, symmetric and reactive to eye opening and eye closing. Sleep was characterized by vertex waves, sleep spindles (12 to 14 Hz), maximal frontocentral region. EEG showed intermittent generalized 3 to 6 Hz theta-delta slowing. Hyperventilation and photic stimulation were not performed.   ABNORMALITY - Intermittent slow, generalized IMPRESSION: This study is suggestive of mild diffuse encephalopathy, nonspecific etiology. No seizures or epileptiform discharges were seen throughout the recording. PLora Havens  MR BRAIN WO CONTRAST  Result Date: 06/01/2022 CLINICAL DATA:  History of dementia and breast cancer, presenting with dyspnea and cough. EXAM: MRI HEAD WITHOUT CONTRAST TECHNIQUE: Multiplanar, multiecho pulse sequences of the brain and surrounding structures were obtained without intravenous contrast. COMPARISON:  CT head 07/02/2020 FINDINGS: Brain:  There is no evidence of acute infarct. There is a thin right cerebral convexity subdural collection measuring 2-3 mm, and a thin collection overlying the left frontal lobe also measuring 2-3 mm. The right collection was present on the CT head from 07/02/2020; however, the left collection was not definitely seen on the study. There is no abnormal DWI signal abnormality or SWI signal dropout associated with the collections. There is no mass effect on the underlying brain parenchyma. There is marked parenchymal volume loss with prominence of the ventricular system and extra-axial CSF spaces with marked bilateral hippocampal atrophy. There is extensive FLAIR signal abnormality throughout the supratentorial white matter and pons consistent with advanced chronic small-vessel ischemic change. The pituitary and suprasellar region are normal. There is no mass lesion. There is no midline shift. Vascular: Normal flow voids. Skull and upper cervical spine: Normal marrow signal. Sinuses/Orbits: There is layering fluid in the left maxillary sinus. A left lens implant is in place. The globes and orbits are otherwise unremarkable. Other: None. IMPRESSION: 1. 2-3 mm thick subdural collections overlying both cerebral hemispheres are favored to reflect chronic  subdural hematomas, though the left collection is new since the prior head CT from 2022. No mass effect on the underlying brain parenchyma or midline shift. Consider short interval follow-up noncontrast head CT to ensure that the collection is not enlarged. 2. Advanced parenchymal volume loss and chronic small-vessel ischemic change. Electronically Signed   By: Valetta Mole M.D.   On: 06/01/2022 16:18   DG Chest Port 1 View  Result Date: 05/31/2022 CLINICAL DATA:  Hypoxia, RSV, cough EXAM: PORTABLE CHEST 1 VIEW COMPARISON:  05/26/2022 FINDINGS: Single frontal view of the chest demonstrates interval development of bilateral interstitial and ground-glass opacities. There are  areas of denser consolidation seen in the retrocardiac region and at the right lung base. Small left pleural effusion is suspected. No pneumothorax. Cardiac silhouette is unremarkable. IMPRESSION: 1. Interval development of multifocal bilateral airspace disease, greatest at the lung bases, consistent with bilateral pneumonia. 2. Small left pleural effusion, increased since prior study. Electronically Signed   By: Randa Ngo M.D.   On: 05/31/2022 17:34   DG Chest 2 View  Result Date: 05/26/2022 CLINICAL DATA:  Cough, chest congestion EXAM: CHEST - 2 VIEW COMPARISON:  01/03/2022 FINDINGS: Cardiac size is within normal limits. Low position of diaphragms may suggest COPD. Blunting of left lateral CP angle suggests pleural thickening or minimal effusion. Small linear densities are seen in lateral aspect of left lower lung field with interval improvement. Rest of the lung fields are essentially clear. Patient's chin is partly obscuring the apices. IMPRESSION: Blunting of left lateral CP angle suggests small effusion or pleural thickening. Small linear densities in left lower lung fields may suggest scarring or subsegmental atelectasis. Electronically Signed   By: Elmer Picker M.D.   On: 05/26/2022 16:11     The results of significant diagnostics from this hospitalization (including imaging, microbiology, ancillary and laboratory) are listed below for reference.     Microbiology: Recent Results (from the past 240 hour(s))  Blood Culture (routine x 2)     Status: None   Collection Time: 05/31/22 11:50 PM   Specimen: BLOOD  Result Value Ref Range Status   Specimen Description BLOOD BLOOD RIGHT WRIST  Final   Special Requests   Final    BOTTLES DRAWN AEROBIC AND ANAEROBIC Blood Culture adequate volume   Culture   Final    NO GROWTH 5 DAYS Performed at [facility], [address] [place], [place] [address]    Report Status 06/06/2022 FINAL  Final  Blood Culture (routine x 2)      Status: None   Collection Time: 05/31/22 11:50 PM   Specimen: BLOOD  Result Value Ref Range Status   Specimen Description BLOOD BLOOD RIGHT FOREARM  Final   Special Requests   Final    BOTTLES DRAWN AEROBIC AND ANAEROBIC Blood Culture adequate volume   Culture   Final    NO GROWTH 5 DAYS Performed at [facility], [address] [place], [place] [address]    Report Status 06/06/2022 FINAL  Final  Resp panel by RT-PCR (RSV, Flu A&B, Covid) Anterior Nasal Swab     Status: Abnormal   Collection Time: 05/31/22 11:52 PM   Specimen: Anterior Nasal Swab  Result Value Ref Range Status   SARS Coronavirus 2 by RT PCR NEGATIVE NEGATIVE Final    Comment: (NOTE) SARS-CoV-2 target nucleic acids are NOT DETECTED.  The SARS-CoV-2 RNA is generally detectable in upper respiratory specimens during the acute phase of infection. The lowest concentration of  SARS-CoV-2 viral copies this assay can detect is 138 copies/mL. A negative result does not preclude SARS-Cov-2 infection and should not be used as the sole basis for treatment or other patient management decisions. A negative result may occur with  improper specimen collection/handling, submission of specimen other than nasopharyngeal swab, presence of viral mutation(s) within the areas targeted by this assay, and inadequate number of viral copies(<138 copies/mL). A negative result must be combined with clinical observations, patient history, and epidemiological information. The expected result is Negative.  Fact Sheet for Patients:  EntrepreneurPulse.com.au  Fact Sheet for Healthcare Providers:  IncredibleEmployment.be  This test is no t yet approved or cleared by the [place] FDA and  has been authorized for detection and/or diagnosis of SARS-CoV-2 by FDA under an Emergency Use Authorization (EUA). This EUA will remain  in effect (meaning this test can be used) for the duration of  the COVID-19 declaration under Section 564(b)(1) of the Act, 21 U.S.C.section 360bbb-3(b)(1), unless the authorization is terminated  or revoked sooner.       Influenza A by PCR NEGATIVE NEGATIVE Final   Influenza B by PCR NEGATIVE NEGATIVE Final    Comment: (NOTE) The Xpert Xpress SARS-CoV-2/FLU/RSV plus assay is intended as an aid in the diagnosis of influenza from Nasopharyngeal swab specimens and should not be used as a sole basis for treatment. Nasal washings and aspirates are unacceptable for Xpert Xpress SARS-CoV-2/FLU/RSV testing.  Fact Sheet for Patients: EntrepreneurPulse.com.au  Fact Sheet for Healthcare Providers: IncredibleEmployment.be  This test is not yet approved or cleared by the [place] FDA and has been authorized for detection and/or diagnosis of SARS-CoV-2 by FDA under an Emergency Use Authorization (EUA). This EUA will remain in effect (meaning this test can be used) for the duration of the COVID-19 declaration under Section 564(b)(1) of the Act, 21 U.S.C. section 360bbb-3(b)(1), unless the authorization is terminated or revoked.     Resp Syncytial Virus by PCR POSITIVE (A) NEGATIVE Final    Comment: (NOTE) Fact Sheet for Patients: EntrepreneurPulse.com.au  Fact Sheet for Healthcare Providers: IncredibleEmployment.be  This test is not yet approved or cleared by the [place] FDA and has been authorized for detection and/or diagnosis of SARS-CoV-2 by FDA under an Emergency Use Authorization (EUA). This EUA will remain in effect (meaning this test can be used) for the duration of the COVID-19 declaration under Section 564(b)(1) of the Act, 21 U.S.C. section 360bbb-3(b)(1), unless the authorization is terminated or revoked.  Performed at [facility], [address] [place], [place] [address]      Labs: BNP (last 3 results) No results for input(s): "BNP"  in the last 8760 hours. Basic Metabolic Panel: Recent Labs  Lab 06/02/22 0454 06/03/22 0559 06/04/22 0403 06/05/22 0511 06/06/22 0636 06/07/22 0701 06/08/22 0519  NA 140 142 143 140 138 138  --   K 4.3 3.7 3.9 3.5 3.7 3.4*  --   CL 110 115* 113* 109 106 103  --   CO2 '23 22 25 26 22 28  '$ --   GLUCOSE 110* 92 111* 104* 89 96  --   BUN '14 17 19 21 '$ 24* 22  --   CREATININE 0.56 0.50 0.59 0.55 0.53 0.46  --   CALCIUM 8.1* 8.1* 8.2* 8.1* 8.0* 8.1*  --   MG 2.0  --   --   --  1.9 2.0 2.0  PHOS  --   --   --   --   --  3.4  3.1   Liver Function Tests: No results for input(s): "AST", "ALT", "ALKPHOS", "BILITOT", "PROT", "ALBUMIN" in the last 168 hours. No results for input(s): "LIPASE", "AMYLASE" in the last 168 hours. No results for input(s): "AMMONIA" in the last 168 hours. CBC: Recent Labs  Lab 06/02/22 0454 06/03/22 0559 06/04/22 0403 06/05/22 0511 06/06/22 0636  WBC 9.4 11.7* 10.2 10.6* 10.7*  HGB 9.8* 10.1* 10.1* 10.2* 10.9*  HCT 30.0* 31.2* 31.6* 31.4* 34.7*  MCV 89.3 88.9 90.0 89.0 92.3  PLT 215 278 269 283 282   Cardiac Enzymes: No results for input(s): "CKTOTAL", "CKMB", "CKMBINDEX", "TROPONINI" in the last 168 hours. BNP: Invalid input(s): "POCBNP" CBG: No results for input(s): "GLUCAP" in the last 168 hours. D-Dimer No results for input(s): "DDIMER" in the last 72 hours. Hgb A1c No results for input(s): "HGBA1C" in the last 72 hours. Lipid Profile No results for input(s): "CHOL", "HDL", "LDLCALC", "TRIG", "CHOLHDL", "LDLDIRECT" in the last 72 hours. Thyroid function studies No results for input(s): "TSH", "T4TOTAL", "T3FREE", "THYROIDAB" in the last 72 hours.  Invalid input(s): "FREET3" Anemia work up No results for input(s): "VITAMINB12", "FOLATE", "FERRITIN", "TIBC", "IRON", "RETICCTPCT" in the last 72 hours. Urinalysis    Component Value Date/Time   COLORURINE YELLOW (A) 01/03/2022 2058   APPEARANCEUR Clear 04/16/2022 1503   LABSPEC 1.010 01/03/2022  2058   LABSPEC 1.012 09/16/2014 1123   PHURINE 5.0 01/03/2022 2058   GLUCOSEU Negative 04/16/2022 1503   GLUCOSEU Negative 09/16/2014 1123   HGBUR NEGATIVE 01/03/2022 2058   BILIRUBINUR Negative 04/16/2022 1503   BILIRUBINUR Negative 09/16/2014 1123   KETONESUR NEGATIVE 01/03/2022 2058   PROTEINUR Negative 04/16/2022 1503   PROTEINUR NEGATIVE 01/03/2022 2058   NITRITE Negative 04/16/2022 1503   NITRITE NEGATIVE 01/03/2022 2058   LEUKOCYTESUR Negative 04/16/2022 1503   LEUKOCYTESUR NEGATIVE 01/03/2022 2058   LEUKOCYTESUR Negative 09/16/2014 1123   Sepsis Labs Recent Labs  Lab 06/03/22 0559 06/04/22 0403 06/05/22 0511 06/06/22 0636  WBC 11.7* 10.2 10.6* 10.7*   Microbiology Recent Results (from the past 240 hour(s))  Blood Culture (routine x 2)     Status: None   Collection Time: 05/31/22 11:50 PM   Specimen: BLOOD  Result Value Ref Range Status   Specimen Description BLOOD BLOOD RIGHT WRIST  Final   Special Requests   Final    BOTTLES DRAWN AEROBIC AND ANAEROBIC Blood Culture adequate volume   Culture   Final    NO GROWTH 5 DAYS Performed at [facility], [address] [place], [place] [address]    Report Status 06/06/2022 FINAL  Final  Blood Culture (routine x 2)     Status: None   Collection Time: 05/31/22 11:50 PM   Specimen: BLOOD  Result Value Ref Range Status   Specimen Description BLOOD BLOOD RIGHT FOREARM  Final   Special Requests   Final    BOTTLES DRAWN AEROBIC AND ANAEROBIC Blood Culture adequate volume   Culture   Final    NO GROWTH 5 DAYS Performed at [facility], [address] [place], [place] [address]    Report Status 06/06/2022 FINAL  Final  Resp panel by RT-PCR (RSV, Flu A&B, Covid) Anterior Nasal Swab     Status: Abnormal   Collection Time: 05/31/22 11:52 PM   Specimen: Anterior Nasal Swab  Result Value Ref Range Status   SARS Coronavirus 2 by RT PCR NEGATIVE NEGATIVE Final    Comment: (NOTE) SARS-CoV-2 target  nucleic acids are NOT DETECTED.  The SARS-CoV-2 RNA  is generally detectable in upper respiratory specimens during the acute phase of infection. The lowest concentration of SARS-CoV-2 viral copies this assay can detect is 138 copies/mL. A negative result does not preclude SARS-Cov-2 infection and should not be used as the sole basis for treatment or other patient management decisions. A negative result may occur with  improper specimen collection/handling, submission of specimen other than nasopharyngeal swab, presence of viral mutation(s) within the areas targeted by this assay, and inadequate number of viral copies(<138 copies/mL). A negative result must be combined with clinical observations, patient history, and epidemiological information. The expected result is Negative.  Fact Sheet for Patients:  EntrepreneurPulse.com.au  Fact Sheet for Healthcare Providers:  IncredibleEmployment.be  This test is no t yet approved or cleared by the [place] FDA and  has been authorized for detection and/or diagnosis of SARS-CoV-2 by FDA under an Emergency Use Authorization (EUA). This EUA will remain  in effect (meaning this test can be used) for the duration of the COVID-19 declaration under Section 564(b)(1) of the Act, 21 U.S.C.section 360bbb-3(b)(1), unless the authorization is terminated  or revoked sooner.       Influenza A by PCR NEGATIVE NEGATIVE Final   Influenza B by PCR NEGATIVE NEGATIVE Final    Comment: (NOTE) The Xpert Xpress SARS-CoV-2/FLU/RSV plus assay is intended as an aid in the diagnosis of influenza from Nasopharyngeal swab specimens and should not be used as a sole basis for treatment. Nasal washings and aspirates are unacceptable for Xpert Xpress SARS-CoV-2/FLU/RSV testing.  Fact Sheet for Patients: EntrepreneurPulse.com.au  Fact Sheet for Healthcare  Providers: IncredibleEmployment.be  This test is not yet approved or cleared by the [place] FDA and has been authorized for detection and/or diagnosis of SARS-CoV-2 by FDA under an Emergency Use Authorization (EUA). This EUA will remain in effect (meaning this test can be used) for the duration of the COVID-19 declaration under Section 564(b)(1) of the Act, 21 U.S.C. section 360bbb-3(b)(1), unless the authorization is terminated or revoked.     Resp Syncytial Virus by PCR POSITIVE (A) NEGATIVE Final    Comment: (NOTE) Fact Sheet for Patients: EntrepreneurPulse.com.au  Fact Sheet for Healthcare Providers: IncredibleEmployment.be  This test is not yet approved or cleared by the [place] FDA and has been authorized for detection and/or diagnosis of SARS-CoV-2 by FDA under an Emergency Use Authorization (EUA). This EUA will remain in effect (meaning this test can be used) for the duration of the COVID-19 declaration under Section 564(b)(1) of the Act, 21 U.S.C. section 360bbb-3(b)(1), unless the authorization is terminated or revoked.  Performed at [facility], [address] [place], [place] [address]      Time coordinating discharge:  I have spent 35 minutes face to face with the patient and on the ward discussing the patients care, assessment, plan and disposition with other care givers. >50% of the time was devoted counseling the patient about the risks and benefits of treatment/Discharge disposition and coordinating care.   SIGNED:   Damita Lack, MD  [facility] 06/08/2022, 1:03 PM   If 7PM-7AM, please contact night-coverage

## 2022-06-08 NOTE — TOC Transition Note (Signed)
Transition of Care [facility]) - CM/SW Discharge Note   Patient Details  Name: AMILIANA FOUTZ MRN: [id number] Date of Birth: Aug 25, 1923  Transition of Care [facility]) CM/SW Contact:  Candie Chroman, LCSW Phone Number: 06/08/2022, 1:34 PM   Clinical Narrative:  Patient has orders to discharge home today. DME has been delivered to the home. EMS transport has been arranged and she is 5th on the list. No further concerns. CSW signing off.   Final next level of care: Home w Hospice Care Barriers to Discharge: Barriers Resolved   Patient Goals and CMS Choice   Choice offered to / list presented to : Adult Children  Discharge Placement                  Patient to be transferred to facility by: EMS Name of family member notified: Drema Eddington Patient and family notified of of transfer: 06/08/22  Discharge Plan and Services Additional resources added to the After Visit Summary for       Post Acute Care Choice: Durable Medical Equipment, Hospice                               Social Determinants of Health (SDOH) Interventions SDOH Screenings   Food Insecurity: No Food Insecurity (06/02/2022)  Housing: Low Risk  (06/02/2022)  Transportation Needs: No Transportation Needs (06/02/2022)  Utilities: Not At Risk (06/02/2022)  Tobacco Use: Low Risk  (06/05/2022)     Readmission Risk Interventions     No data to display

## 2022-06-08 NOTE — Progress Notes (Signed)
Palliative: Chart review completed.  [facility] is to discharge home today with the benefits of [facility] hospice care.  TOC has set up transportation.  No additional needs at this time.  DNR/goldenrod form on chart.  No charge  Beth Axe, NP Palliative medicine team Team phone [phone] Greater than 50% of this time was spent counseling and coordinating care related to the above assessment and plan.

## 2022-06-08 NOTE — Progress Notes (Signed)
[facility])    If applicable, please send signed and completed DNR with patient/family upon discharge. Please provide prescriptions at discharge as needed to ensure ongoing symptom management and a transport packet.   [facility] information and contact numbers given to family and above information shared with TOC.    Please call with any questions/concerns.    Thank you for the opportunity to participate in this patient's care   Daphene Calamity, MSW [facility] Liaison  [phone]

## 2022-06-27 DIAGNOSIS — F0154 Vascular dementia, unspecified severity, with anxiety: Secondary | ICD-10-CM | POA: Diagnosis not present

## 2022-06-27 DIAGNOSIS — A419 Sepsis, unspecified organism: Secondary | ICD-10-CM | POA: Diagnosis not present

## 2022-06-27 DIAGNOSIS — J9691 Respiratory failure, unspecified with hypoxia: Secondary | ICD-10-CM | POA: Diagnosis not present

## 2022-06-27 DIAGNOSIS — F0283 Dementia in other diseases classified elsewhere, unspecified severity, with mood disturbance: Secondary | ICD-10-CM | POA: Diagnosis not present

## 2022-06-27 DIAGNOSIS — J121 Respiratory syncytial virus pneumonia: Secondary | ICD-10-CM | POA: Diagnosis not present

## 2022-06-27 DIAGNOSIS — F0284 Dementia in other diseases classified elsewhere, unspecified severity, with anxiety: Secondary | ICD-10-CM | POA: Diagnosis not present

## 2022-06-27 DIAGNOSIS — G309 Alzheimer's disease, unspecified: Secondary | ICD-10-CM | POA: Diagnosis not present

## 2022-09-11 DIAGNOSIS — F0284 Dementia in other diseases classified elsewhere, unspecified severity, with anxiety: Secondary | ICD-10-CM | POA: Diagnosis not present

## 2022-09-11 DIAGNOSIS — I69891 Dysphagia following other cerebrovascular disease: Secondary | ICD-10-CM | POA: Diagnosis not present

## 2022-09-11 DIAGNOSIS — G309 Alzheimer's disease, unspecified: Secondary | ICD-10-CM | POA: Diagnosis not present

## 2022-09-11 DIAGNOSIS — F0154 Vascular dementia, unspecified severity, with anxiety: Secondary | ICD-10-CM | POA: Diagnosis not present

## 2022-09-11 DIAGNOSIS — I69818 Other symptoms and signs involving cognitive functions following other cerebrovascular disease: Secondary | ICD-10-CM | POA: Diagnosis not present

## 2022-09-11 DIAGNOSIS — F0153 Vascular dementia, unspecified severity, with mood disturbance: Secondary | ICD-10-CM | POA: Diagnosis not present

## 2022-09-11 DIAGNOSIS — F0283 Dementia in other diseases classified elsewhere, unspecified severity, with mood disturbance: Secondary | ICD-10-CM | POA: Diagnosis not present

## 2022-09-28 DIAGNOSIS — I69891 Dysphagia following other cerebrovascular disease: Secondary | ICD-10-CM | POA: Diagnosis not present

## 2022-09-28 DIAGNOSIS — F0154 Vascular dementia, unspecified severity, with anxiety: Secondary | ICD-10-CM | POA: Diagnosis not present

## 2022-09-28 DIAGNOSIS — I69818 Other symptoms and signs involving cognitive functions following other cerebrovascular disease: Secondary | ICD-10-CM | POA: Diagnosis not present

## 2022-09-28 DIAGNOSIS — F0283 Dementia in other diseases classified elsewhere, unspecified severity, with mood disturbance: Secondary | ICD-10-CM | POA: Diagnosis not present

## 2022-09-28 DIAGNOSIS — G309 Alzheimer's disease, unspecified: Secondary | ICD-10-CM | POA: Diagnosis not present

## 2022-09-28 DIAGNOSIS — F0153 Vascular dementia, unspecified severity, with mood disturbance: Secondary | ICD-10-CM | POA: Diagnosis not present

## 2022-09-28 DIAGNOSIS — F0284 Dementia in other diseases classified elsewhere, unspecified severity, with anxiety: Secondary | ICD-10-CM | POA: Diagnosis not present

## 2022-10-05 DIAGNOSIS — F0153 Vascular dementia, unspecified severity, with mood disturbance: Secondary | ICD-10-CM | POA: Diagnosis not present

## 2022-10-05 DIAGNOSIS — I69891 Dysphagia following other cerebrovascular disease: Secondary | ICD-10-CM | POA: Diagnosis not present

## 2022-10-05 DIAGNOSIS — F0284 Dementia in other diseases classified elsewhere, unspecified severity, with anxiety: Secondary | ICD-10-CM | POA: Diagnosis not present

## 2022-10-05 DIAGNOSIS — I69818 Other symptoms and signs involving cognitive functions following other cerebrovascular disease: Secondary | ICD-10-CM | POA: Diagnosis not present

## 2022-10-05 DIAGNOSIS — F0283 Dementia in other diseases classified elsewhere, unspecified severity, with mood disturbance: Secondary | ICD-10-CM | POA: Diagnosis not present

## 2022-10-05 DIAGNOSIS — G309 Alzheimer's disease, unspecified: Secondary | ICD-10-CM | POA: Diagnosis not present

## 2022-10-05 DIAGNOSIS — F0154 Vascular dementia, unspecified severity, with anxiety: Secondary | ICD-10-CM | POA: Diagnosis not present

## 2022-12-13 DIAGNOSIS — F0153 Vascular dementia, unspecified severity, with mood disturbance: Secondary | ICD-10-CM | POA: Diagnosis not present

## 2022-12-13 DIAGNOSIS — F0284 Dementia in other diseases classified elsewhere, unspecified severity, with anxiety: Secondary | ICD-10-CM | POA: Diagnosis not present

## 2022-12-13 DIAGNOSIS — F0154 Vascular dementia, unspecified severity, with anxiety: Secondary | ICD-10-CM | POA: Diagnosis not present

## 2022-12-13 DIAGNOSIS — G309 Alzheimer's disease, unspecified: Secondary | ICD-10-CM | POA: Diagnosis not present

## 2022-12-13 DIAGNOSIS — F0283 Dementia in other diseases classified elsewhere, unspecified severity, with mood disturbance: Secondary | ICD-10-CM | POA: Diagnosis not present

## 2022-12-13 DIAGNOSIS — I69818 Other symptoms and signs involving cognitive functions following other cerebrovascular disease: Secondary | ICD-10-CM | POA: Diagnosis not present

## 2022-12-13 DIAGNOSIS — I69891 Dysphagia following other cerebrovascular disease: Secondary | ICD-10-CM | POA: Diagnosis not present

## 2022-12-28 DIAGNOSIS — F0283 Dementia in other diseases classified elsewhere, unspecified severity, with mood disturbance: Secondary | ICD-10-CM | POA: Diagnosis not present

## 2022-12-28 DIAGNOSIS — F0153 Vascular dementia, unspecified severity, with mood disturbance: Secondary | ICD-10-CM | POA: Diagnosis not present

## 2022-12-28 DIAGNOSIS — I69891 Dysphagia following other cerebrovascular disease: Secondary | ICD-10-CM | POA: Diagnosis not present

## 2022-12-28 DIAGNOSIS — G309 Alzheimer's disease, unspecified: Secondary | ICD-10-CM | POA: Diagnosis not present

## 2022-12-28 DIAGNOSIS — I69818 Other symptoms and signs involving cognitive functions following other cerebrovascular disease: Secondary | ICD-10-CM | POA: Diagnosis not present

## 2022-12-28 DIAGNOSIS — F0154 Vascular dementia, unspecified severity, with anxiety: Secondary | ICD-10-CM | POA: Diagnosis not present

## 2022-12-28 DIAGNOSIS — F0284 Dementia in other diseases classified elsewhere, unspecified severity, with anxiety: Secondary | ICD-10-CM | POA: Diagnosis not present

## 2023-01-28 DIAGNOSIS — I69891 Dysphagia following other cerebrovascular disease: Secondary | ICD-10-CM | POA: Diagnosis not present

## 2023-01-28 DIAGNOSIS — F0283 Dementia in other diseases classified elsewhere, unspecified severity, with mood disturbance: Secondary | ICD-10-CM | POA: Diagnosis not present

## 2023-01-28 DIAGNOSIS — G309 Alzheimer's disease, unspecified: Secondary | ICD-10-CM | POA: Diagnosis not present

## 2023-01-28 DIAGNOSIS — F0153 Vascular dementia, unspecified severity, with mood disturbance: Secondary | ICD-10-CM | POA: Diagnosis not present

## 2023-01-28 DIAGNOSIS — F0154 Vascular dementia, unspecified severity, with anxiety: Secondary | ICD-10-CM | POA: Diagnosis not present

## 2023-01-28 DIAGNOSIS — F0284 Dementia in other diseases classified elsewhere, unspecified severity, with anxiety: Secondary | ICD-10-CM | POA: Diagnosis not present

## 2023-01-28 DIAGNOSIS — I69818 Other symptoms and signs involving cognitive functions following other cerebrovascular disease: Secondary | ICD-10-CM | POA: Diagnosis not present

## 2023-01-29 DIAGNOSIS — F0153 Vascular dementia, unspecified severity, with mood disturbance: Secondary | ICD-10-CM | POA: Diagnosis not present

## 2023-01-29 DIAGNOSIS — F0284 Dementia in other diseases classified elsewhere, unspecified severity, with anxiety: Secondary | ICD-10-CM | POA: Diagnosis not present

## 2023-01-29 DIAGNOSIS — G309 Alzheimer's disease, unspecified: Secondary | ICD-10-CM | POA: Diagnosis not present

## 2023-01-29 DIAGNOSIS — F0154 Vascular dementia, unspecified severity, with anxiety: Secondary | ICD-10-CM | POA: Diagnosis not present

## 2023-01-29 DIAGNOSIS — F0283 Dementia in other diseases classified elsewhere, unspecified severity, with mood disturbance: Secondary | ICD-10-CM | POA: Diagnosis not present

## 2023-01-29 DIAGNOSIS — I69891 Dysphagia following other cerebrovascular disease: Secondary | ICD-10-CM | POA: Diagnosis not present

## 2023-01-29 DIAGNOSIS — I69818 Other symptoms and signs involving cognitive functions following other cerebrovascular disease: Secondary | ICD-10-CM | POA: Diagnosis not present

## 2023-06-05 DEATH — deceased
